# Patient Record
Sex: Male | Born: 2000 | Race: Black or African American | Hispanic: No | Marital: Single | Smoking: Never smoker
Health system: Southern US, Community
[De-identification: ages and names within clinical notes are randomized; demographics above are authoritative.]

## PROBLEM LIST (undated history)

## (undated) ENCOUNTER — Ambulatory Visit (HOSPITAL_COMMUNITY): Admission: EM | Payer: Medicaid Other | Source: Home / Self Care

## (undated) ENCOUNTER — Ambulatory Visit: Payer: Commercial Managed Care - HMO

## (undated) DIAGNOSIS — Z9109 Other allergy status, other than to drugs and biological substances: Secondary | ICD-10-CM

## (undated) DIAGNOSIS — F1110 Opioid abuse, uncomplicated: Secondary | ICD-10-CM

## (undated) DIAGNOSIS — F1410 Cocaine abuse, uncomplicated: Secondary | ICD-10-CM

## (undated) DIAGNOSIS — J45909 Unspecified asthma, uncomplicated: Secondary | ICD-10-CM

## (undated) DIAGNOSIS — F1910 Other psychoactive substance abuse, uncomplicated: Secondary | ICD-10-CM

## (undated) DIAGNOSIS — J302 Other seasonal allergic rhinitis: Secondary | ICD-10-CM

## (undated) HISTORY — PX: TYMPANOSTOMY TUBE PLACEMENT: SHX32

---
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
## 2020-02-17 ENCOUNTER — Encounter (HOSPITAL_COMMUNITY): Payer: Self-pay | Admitting: Emergency Medicine

## 2020-02-17 ENCOUNTER — Other Ambulatory Visit: Payer: Self-pay

## 2020-02-17 ENCOUNTER — Ambulatory Visit (HOSPITAL_COMMUNITY)
Admission: EM | Admit: 2020-02-17 | Discharge: 2020-02-17 | Disposition: A | Discharge status: Home / Self Care | Payer: Medicaid Other | Attending: Family Medicine | Admitting: Family Medicine

## 2020-02-17 DIAGNOSIS — R369 Urethral discharge, unspecified: Secondary | ICD-10-CM | POA: Insufficient documentation

## 2020-02-17 DIAGNOSIS — Z711 Person with feared health complaint in whom no diagnosis is made: Secondary | ICD-10-CM | POA: Diagnosis present

## 2020-02-17 MED ORDER — CEFTRIAXONE SODIUM 500 MG IJ SOLR
500.0000 mg | Freq: Once | INTRAMUSCULAR | Status: AC
  Administered 2020-02-17: 500 mg via INTRAMUSCULAR

## 2020-02-17 MED ORDER — DOXYCYCLINE HYCLATE 100 MG PO CAPS: 100.0000 mg | ORAL_CAPSULE | Freq: Two times a day (BID) | ORAL | 0 refills | Status: DC

## 2020-02-17 MED ORDER — LIDOCAINE HCL (PF) 1 % IJ SOLN
INTRAMUSCULAR | Status: AC
  Filled 2020-02-17: qty 2

## 2020-02-17 MED ORDER — CEFTRIAXONE SODIUM 500 MG IJ SOLR
INTRAMUSCULAR | Status: AC
  Filled 2020-02-17: qty 500

## 2020-02-17 NOTE — ED Provider Notes (Signed)
[facility]   [id number] 02/17/20 Arrival Time: 4098  ASSESSMENT & PLAN:  1. Penile discharge   2. Concern about STD in male without diagnosis       Discharge Instructions     You have been given the following today for treatment of suspected gonorrhea and/or chlamydia:  cefTRIAXone (ROCEPHIN) injection 500 mg  Please pick up your prescription for doxycycline 100 mg and begin taking twice daily for the next seven (7) days.  Even though we have treated you today, we have sent testing for sexually transmitted infections. We will notify you of any positive results once they are received. If required, we will prescribe any medications you might need.  Please refrain from all sexual activity for at least the next seven days.     Pending: Labs Reviewed  CYTOLOGY, (ORAL, ANAL, URETHRAL) ANCILLARY ONLY    Will notify of any positive results. Instructed to refrain from sexual activity for at least seven days.  Reviewed expectations re: course of current medical issues. Questions answered. Outlined signs and symptoms indicating need for more acute intervention. Patient verbalized understanding. After Visit Summary given.   SUBJECTIVE:  Steven Hood is a 20 y.o. male who presents with complaint of penile discharge. Onset abrupt. First noticed yesterday. Describes discharge as thick and opaque. No specific aggravating or alleviating factors reported. Denies: urinary frequency, dysuria and gross hematuria. Afebrile. No abdominal or pelvic pain. No n/v. No rashes or lesions. Reports that he is sexually active with multiple male partners (2). .lab OTC treatment: none. History of STI: "yes" but does not elaborate.   OBJECTIVE:  Vitals:   02/17/20 1011  BP: 128/75  Pulse: 72  Resp: 16  Temp: 98.8 F (37.1 C)  TempSrc: Oral  SpO2: 97%     General appearance: alert, cooperative, appears stated age and no distress Throat: lips, mucosa, and tongue normal;  teeth and gums normal Lungs: unlabored respirations; speaks full sentences without difficulty Back: no CVA tenderness; FROM at waist Abdomen: soft, non-tender GU: deferred Skin: warm and dry Psychological: alert and cooperative; normal mood and affect.      Allergies  Allergen Reactions  . Other Anaphylaxis    All Nuts  . Peanut-Containing Drug Products   . Shellfish Allergy Hives, Swelling and Rash    All Seafood    Past Medical History:  Diagnosis Date  . Asthma   . Environmental allergies   . Seasonal allergies    Family History  Problem Relation Age of Onset  . Healthy Mother   . Healthy Father    Social History   Socioeconomic History  . Marital status: Single    Spouse name: Not on file  . Number of children: Not on file  . Years of education: Not on file  . Highest education level: Not on file  Occupational History  . Not on file  Tobacco Use  . Smoking status: Never Smoker  . Smokeless tobacco: Never Used  Vaping Use  . Vaping Use: Never used  Substance and Sexual Activity  . Alcohol use: No  . Drug use: Yes    Frequency: 7.0 times per week    Types: Marijuana  . Sexual activity: Never  Other Topics Concern  . Not on file  Social History Narrative  . Not on file   Social Determinants of Health   Financial Resource Strain: Not on file  Food Insecurity: Not on file  Transportation Needs: Not on file  Physical Activity: Not on  file  Stress: Not on file  Social Connections: Not on file  Intimate Partner Violence: Not on file          Mardella Layman, MD 02/17/20 1042

## 2020-02-17 NOTE — Discharge Instructions (Addendum)

## 2020-02-17 NOTE — ED Triage Notes (Signed)
Presents with penile discharge and dysuria xs 2 days. States dysuria has subsided now.

## 2020-02-18 LAB — CYTOLOGY, (ORAL, ANAL, URETHRAL) ANCILLARY ONLY
Chlamydia: POSITIVE — AB
Comment: NEGATIVE
Comment: NEGATIVE
Comment: NORMAL
Neisseria Gonorrhea: NEGATIVE
Trichomonas: NEGATIVE

## 2020-05-16 ENCOUNTER — Emergency Department (HOSPITAL_COMMUNITY)
Admission: EM | Admit: 2020-05-16 | Discharge: 2020-05-16 | Disposition: A | Discharge status: Home / Self Care | Payer: Medicaid Other | Attending: Emergency Medicine | Admitting: Emergency Medicine

## 2020-05-16 ENCOUNTER — Other Ambulatory Visit: Payer: Self-pay

## 2020-05-16 ENCOUNTER — Emergency Department (HOSPITAL_COMMUNITY): Payer: Medicaid Other

## 2020-05-16 ENCOUNTER — Encounter (HOSPITAL_COMMUNITY): Payer: Self-pay | Admitting: Emergency Medicine

## 2020-05-16 DIAGNOSIS — W3400XA Accidental discharge from unspecified firearms or gun, initial encounter: Secondary | ICD-10-CM | POA: Diagnosis not present

## 2020-05-16 DIAGNOSIS — Z5321 Procedure and treatment not carried out due to patient leaving prior to being seen by health care provider: Secondary | ICD-10-CM | POA: Insufficient documentation

## 2020-05-16 DIAGNOSIS — S61501A Unspecified open wound of right wrist, initial encounter: Secondary | ICD-10-CM | POA: Insufficient documentation
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
## 2020-05-16 NOTE — ED Notes (Addendum)
Pt called 3x no answer  

## 2020-09-23 ENCOUNTER — Other Ambulatory Visit: Payer: Self-pay

## 2020-09-23 ENCOUNTER — Ambulatory Visit (HOSPITAL_COMMUNITY)
Admission: EM | Admit: 2020-09-23 | Discharge: 2020-09-23 | Disposition: A | Discharge status: Home / Self Care | Payer: Medicaid Other | Attending: Physician Assistant | Admitting: Physician Assistant

## 2020-09-23 ENCOUNTER — Encounter (HOSPITAL_COMMUNITY): Payer: Self-pay

## 2020-09-23 DIAGNOSIS — R369 Urethral discharge, unspecified: Secondary | ICD-10-CM | POA: Diagnosis not present

## 2020-09-23 DIAGNOSIS — Z113 Encounter for screening for infections with a predominantly sexual mode of transmission: Secondary | ICD-10-CM | POA: Diagnosis not present

## 2020-09-23 DIAGNOSIS — H66002 Acute suppurative otitis media without spontaneous rupture of ear drum, left ear: Secondary | ICD-10-CM | POA: Insufficient documentation

## 2020-09-23 MED ORDER — CEFTRIAXONE SODIUM 500 MG IJ SOLR
INTRAMUSCULAR | Status: AC
  Filled 2020-09-23: qty 500

## 2020-09-23 MED ORDER — LIDOCAINE HCL (PF) 2 % IJ SOLN
INTRAMUSCULAR | Status: AC
  Filled 2020-09-23: qty 5

## 2020-09-23 MED ORDER — CEFTRIAXONE SODIUM 500 MG IJ SOLR
500.0000 mg | Freq: Once | INTRAMUSCULAR | Status: AC
  Administered 2020-09-23: 500 mg via INTRAMUSCULAR

## 2020-09-23 MED ORDER — AMOXICILLIN-POT CLAVULANATE 875-125 MG PO TABS: 1.0000 | ORAL_TABLET | Freq: Two times a day (BID) | ORAL | 0 refills | Status: DC

## 2020-09-23 NOTE — Discharge Instructions (Addendum)
We treated you for gonorrhea.  We will send off your swab and contact you if we need to arrange any additional treatment.  Please avoid sexual contact until you receive your test results that have been completely treated.  All partners need to be tested and treated as well.  You have an ear infection on exam.  Please start Augmentin twice daily for 7 days.  You can use over-the-counter medication such as Tylenol and ibuprofen for additional pain relief.  If your symptoms are not improving please return for reevaluation.

## 2020-09-23 NOTE — ED Provider Notes (Signed)
[facility]    CSN: [id number] Arrival date & time: 09/23/20  1635      History   Chief Complaint Chief Complaint  Patient presents with   SEXUALLY TRANSMITTED DISEASE    HPI Steven Hood is a 20 y.o. male.   Patient presents today with a 2-day history of penile discharge.  He reports some associated dysuria but denies additional symptoms including fever, abdominal pain, genital lesions, nausea, vomiting.  Reports that he had unprotected sex last week and reports symptoms began several days after this encounter.  He does have a history of STI in the past and has been treated for gonorrhea and chlamydia; reports last time he was treated was approximately a year ago he completed treatment and had negative test of cure.  He does not consistently use condoms.  He is interested in treatment today if appropriate.  In addition, patient reports a several week history of worsening left ear pain.  He reports having some drainage from the ear after he went swimming earlier this summer but then has developed worsening otalgia.  He denies any decreased hearing, fever, nasal congestion.  Denies any recent illness.  He denies any recent antibiotic use.  He has not tried any over-the-counter medications for symptom management.  He does have a history of allergies and is taking allergy medication as needed.   Past Medical History:  Diagnosis Date   Asthma    Environmental allergies    Seasonal allergies     Patient Active Problem List   Diagnosis Date Noted   ASTHMA 08/20/2006    Past Surgical History:  Procedure Laterality Date   TYMPANOSTOMY TUBE PLACEMENT         Home Medications    Prior to Admission medications   Medication Sig Start Date End Date Taking? Authorizing Provider  amoxicillin-clavulanate (AUGMENTIN) 875-125 MG tablet Take 1 tablet by mouth every 12 (twelve) hours. 09/23/20  Yes Casmir Auguste K, PA-C  ibuprofen (ADVIL,MOTRIN) 400 MG tablet Take 1 tablet  (400 mg total) by mouth every 6 (six) hours as needed for pain. 06/15/12   Elwin Mocha, MD  albuterol (PROVENTIL HFA;VENTOLIN HFA) 108 (90 BASE) MCG/ACT inhaler Inhale 2 puffs into the lungs every 6 (six) hours as needed for wheezing.  11/08/18  [provider]  fluticasone (FLONASE) 50 MCG/ACT nasal spray Place 1 spray into both nostrils 2 (two) times daily. 12/09/12 11/08/18  Autumn Messing H, PA-C  montelukast (SINGULAIR) 5 MG chewable tablet Chew 5 mg by mouth at bedtime.  11/08/18  [provider]    Family History Family History  Problem Relation Age of Onset   Healthy Mother    Healthy Father     Social History Social History   Tobacco Use   Smoking status: Never   Smokeless tobacco: Never  Vaping Use   Vaping Use: Never used  Substance Use Topics   Alcohol use: No   Drug use: Yes    Frequency: 7.0 times per week    Types: Marijuana     Allergies   Other, Peanut-containing drug products, and Shellfish allergy   Review of Systems Review of Systems  Constitutional:  Negative for activity change, appetite change, fatigue and fever.  HENT:  Positive for ear pain. Negative for congestion, sinus pressure, sneezing and sore throat.   Respiratory:  Negative for cough and shortness of breath.   Cardiovascular:  Negative for chest pain.  Gastrointestinal:  Negative for abdominal pain, diarrhea, nausea and vomiting.  Genitourinary:  Positive for dysuria and penile discharge. Negative for frequency, hematuria, penile pain and urgency.    Physical Exam Triage Vital Signs ED Triage Vitals  Enc Vitals Group     BP 09/23/20 1736 121/70     Pulse Rate 09/23/20 1736 88     Resp 09/23/20 1736 16     Temp 09/23/20 1736 98.8 F (37.1 C)     Temp Source 09/23/20 1736 Oral     SpO2 09/23/20 1736 96 %     Weight --      Height --      Head Circumference --      Peak Flow --      Pain Score 09/23/20 1734 0     Pain Loc --      Pain Edu? --      Excl. in GC?  --    No data found.  Updated Vital Signs BP 121/70 (BP Location: Right Arm)   Pulse 88   Temp 98.8 F (37.1 C) (Oral)   Resp 16   SpO2 96%   Visual Acuity Right Eye Distance:   Left Eye Distance:   Bilateral Distance:    Right Eye Near:   Left Eye Near:    Bilateral Near:     Physical Exam Vitals reviewed.  Constitutional:      General: He is awake.     Appearance: Normal appearance. He is normal weight. He is not ill-appearing.     Comments: Very pleasant male appears stated age in no acute distress sitting comfortably in exam room  HENT:     Head: Normocephalic and atraumatic.     Right Ear: Tympanic membrane, ear canal and external ear normal. Tympanic membrane is not erythematous or bulging.     Left Ear: Ear canal and external ear normal. Tympanic membrane is injected and bulging. Tympanic membrane is not erythematous.     Nose: Nose normal.     Mouth/Throat:     Pharynx: Uvula midline. No oropharyngeal exudate or posterior oropharyngeal erythema.  Cardiovascular:     Rate and Rhythm: Normal rate and regular rhythm.     Heart sounds: Normal heart sounds, S1 normal and S2 normal. No murmur heard. Pulmonary:     Effort: Pulmonary effort is normal. No accessory muscle usage or respiratory distress.     Breath sounds: Normal breath sounds. No stridor. No wheezing, rhonchi or rales.     Comments: Clear to auscultation bilaterally Abdominal:     General: Bowel sounds are normal.     Palpations: Abdomen is soft.     Tenderness: There is no abdominal tenderness.  Genitourinary:    Comments: Exam deferred Neurological:     Mental Status: He is alert.  Psychiatric:        Behavior: Behavior is cooperative.     UC Treatments / Results  Labs (all labs ordered are listed, but only abnormal results are displayed) Labs Reviewed  CYTOLOGY, (ORAL, ANAL, URETHRAL) ANCILLARY ONLY    EKG   Radiology No results found.  Procedures Procedures (including critical  care time)  Medications Ordered in UC Medications  cefTRIAXone (ROCEPHIN) injection 500 mg (has no administration in time range)    Initial Impression / Assessment and Plan / UC Course  I have reviewed the triage vital signs and the nursing notes.  Pertinent labs & imaging results that were available during my care of the patient were reviewed by me and considered in my medical decision making (see  chart for details).      STI swab collected today-results pending.  Patient was empirically treated for gonorrhea given symptoms will defer additional treatment until STI results are obtained.  Discussed the importance of safe sex.  He was instructed to to avoid sexual contact until results are obtained and all partners are tested and treated.  Discussed alarm symptoms that warrant emergent evaluation.  Patient was noted to have otitis media on physical exam and so was started on Augmentin.  Recommended that he continue over-the-counter allergy medications including Flonase and antihistamine for symptom relief.  He can use over-the-counter medication for symptom management.  Discussed that if symptoms are not improving or if he has any worsening symptoms he needs to be reevaluated.  Strict return precautions given to which she expressed understanding.  Final Clinical Impressions(s) / UC Diagnoses   Final diagnoses:  Penile discharge  Routine screening for STI (sexually transmitted infection)  Non-recurrent acute suppurative otitis media of left ear without spontaneous rupture of tympanic membrane     Discharge Instructions      We treated you for gonorrhea.  We will send off your swab and contact you if we need to arrange any additional treatment.  Please avoid sexual contact until you receive your test results that have been completely treated.  All partners need to be tested and treated as well.  You have an ear infection on exam.  Please start Augmentin twice daily for 7 days.  You can  use over-the-counter medication such as Tylenol and ibuprofen for additional pain relief.  If your symptoms are not improving please return for reevaluation.     ED Prescriptions     Medication Sig Dispense Auth. Provider   amoxicillin-clavulanate (AUGMENTIN) 875-125 MG tablet Take 1 tablet by mouth every 12 (twelve) hours. 14 tablet Krizia Flight, Noberto Retort, PA-C      PDMP not reviewed this encounter.   Jeani Hawking, PA-C 09/23/20 1826

## 2020-09-23 NOTE — ED Triage Notes (Signed)
Patient presents to Urgent Care for STD testing. Pt states he believes he has chlamydia and gonorrhea. He states he has penile discharge and burning since 2 days ago. Pt states new sexual encounter last Friday. He did not use condoms.   Denies fever or abdominal pain.

## 2020-09-24 ENCOUNTER — Telehealth (HOSPITAL_COMMUNITY): Payer: Self-pay | Admitting: Emergency Medicine

## 2020-09-24 LAB — CYTOLOGY, (ORAL, ANAL, URETHRAL) ANCILLARY ONLY
Chlamydia: POSITIVE — AB
Comment: NEGATIVE
Comment: NEGATIVE
Comment: NORMAL
Neisseria Gonorrhea: POSITIVE — AB
Trichomonas: NEGATIVE

## 2020-09-24 MED ORDER — DOXYCYCLINE HYCLATE 100 MG PO CAPS: 100.0000 mg | ORAL_CAPSULE | Freq: Two times a day (BID) | ORAL | 0 refills | Status: AC

## 2021-01-09 DIAGNOSIS — B009 Herpesviral infection, unspecified: Secondary | ICD-10-CM

## 2021-01-09 HISTORY — DX: Herpesviral infection, unspecified: B00.9

## 2021-02-01 ENCOUNTER — Other Ambulatory Visit: Payer: Self-pay

## 2021-02-02 ENCOUNTER — Other Ambulatory Visit: Payer: Self-pay

## 2021-02-02 ENCOUNTER — Other Ambulatory Visit (HOSPITAL_COMMUNITY)
Admission: RE | Admit: 2021-02-02 | Discharge: 2021-02-02 | Disposition: A | Discharge status: Home / Self Care | Payer: Self-pay | Source: Ambulatory Visit | Attending: Infectious Disease | Admitting: Infectious Disease

## 2021-02-02 ENCOUNTER — Ambulatory Visit (INDEPENDENT_AMBULATORY_CARE_PROVIDER_SITE_OTHER): Payer: Self-pay | Admitting: Pharmacist

## 2021-02-02 DIAGNOSIS — H6690 Otitis media, unspecified, unspecified ear: Secondary | ICD-10-CM

## 2021-02-02 DIAGNOSIS — A549 Gonococcal infection, unspecified: Secondary | ICD-10-CM

## 2021-02-02 DIAGNOSIS — Z113 Encounter for screening for infections with a predominantly sexual mode of transmission: Secondary | ICD-10-CM | POA: Insufficient documentation

## 2021-02-02 DIAGNOSIS — A749 Chlamydial infection, unspecified: Secondary | ICD-10-CM

## 2021-02-02 MED ORDER — CEFTRIAXONE SODIUM 500 MG IJ SOLR
500.0000 mg | Freq: Once | INTRAMUSCULAR | Status: AC
  Administered 2021-02-02: 11:00:00 500 mg via INTRAMUSCULAR

## 2021-02-02 MED ORDER — DOXYCYCLINE HYCLATE 100 MG PO TABS: 100.0000 mg | ORAL_TABLET | Freq: Two times a day (BID) | ORAL | 0 refills | Status: AC

## 2021-02-02 NOTE — Progress Notes (Signed)
° °  02/02/2021  HPI: Steven Hood is a 21 y.o. male who presents to the [facility] clinic today to follow up for STI screening.  Patient Active Problem List   Diagnosis Date Noted   ASTHMA 08/20/2006    Patient's Medications  New Prescriptions   DOXYCYCLINE (VIBRA-TABS) 100 MG TABLET    Take 1 tablet (100 mg total) by mouth 2 (two) times daily for 7 days.  Previous Medications   IBUPROFEN (ADVIL,MOTRIN) 400 MG TABLET    Take 1 tablet (400 mg total) by mouth every 6 (six) hours as needed for pain.  Modified Medications   No medications on file  Discontinued Medications   No medications on file    Allergies: Allergies  Allergen Reactions   Other Anaphylaxis    All Nuts   Peanut-Containing Drug Products    Shellfish Allergy Hives, Swelling and Rash    All Seafood    Past Medical History: Past Medical History:  Diagnosis Date   Asthma    Environmental allergies    Seasonal allergies     Social History: Social History   Socioeconomic History   Marital status: Single    Spouse name: Not on file   Number of children: Not on file   Years of education: Not on file   Highest education level: Not on file  Occupational History   Not on file  Tobacco Use   Smoking status: Never   Smokeless tobacco: Never  Vaping Use   Vaping Use: Never used  Substance and Sexual Activity   Alcohol use: No   Drug use: Yes    Frequency: 7.0 times per week    Types: Marijuana   Sexual activity: Yes    Birth control/protection: None  Other Topics Concern   Not on file  Social History Narrative   Not on file   Social Determinants of Health   Financial Resource Strain: Not on file  Food Insecurity: Not on file  Transportation Needs: Not on file  Physical Activity: Not on file  Stress: Not on file  Social Connections: Not on file     Assessment: Steven Hood presents today for STI screening. Reports symptoms x2 days of penile discharge and burning when urinating. Tested positive for  gonorrhea and chlamydia in 09/2020. Check HIV antibody, RPR, and urine cytologies for gonorrhea/chlamydia. Will call patient and update on results. Will treat empirically for gonorrhea and chlamydia today. Administered ceftriaxone 500mg  IM in the right upper outer quadrant of the gluteal muscle. Injection was tolerated well without issues. Prescription for doxycycline 100mg  BID x 7 days sent to [facility] on [address].  Also reports an ear infection that has persisted for >6 months. Notes discharge, pain, and difficulty hearing from left ear. Referral made to ENT, patient is aware.  Plan: -- Administered ceftriaxone -- Doxycycline sent to [facility] on [address] -- Check HIV antibody, RPR, urine cytologies for gonorrhea/chlamydia -- Referral to ENT placed -- Call with any issues or questions  [facility]) Dhiren Azimi, PharmD Student  02/02/2021, 11:14 AM

## 2021-02-03 LAB — RPR: RPR Ser Ql: NONREACTIVE

## 2021-02-03 LAB — HIV ANTIBODY (ROUTINE TESTING W REFLEX): HIV 1&2 Ab, 4th Generation: NONREACTIVE

## 2021-02-04 LAB — URINE CYTOLOGY ANCILLARY ONLY
Chlamydia: NEGATIVE
Comment: NEGATIVE
Comment: NORMAL
Neisseria Gonorrhea: POSITIVE — AB

## 2021-06-04 IMAGING — CR DG HUMERUS 2V *R*
2 series · 2 of 2 positions shown · non-contrast
Comparison: None.

CLINICAL DATA: Laceration, fall through window

EXAM:
RIGHT HUMERUS - 2+ VIEW

[x humerus ap right]
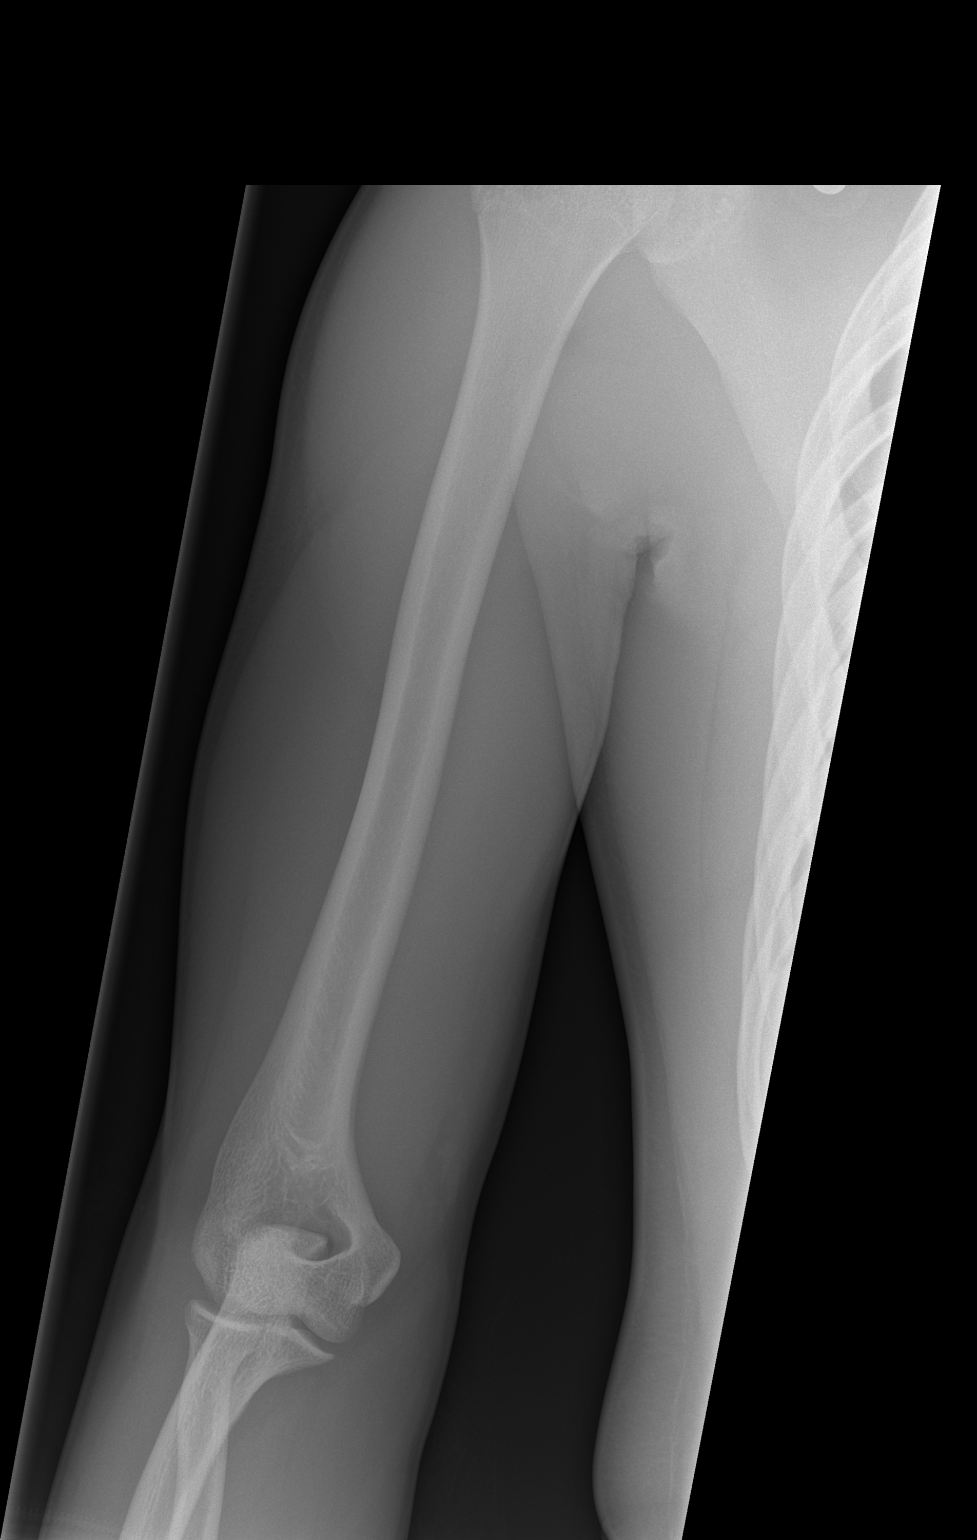

[x humerus lat right]
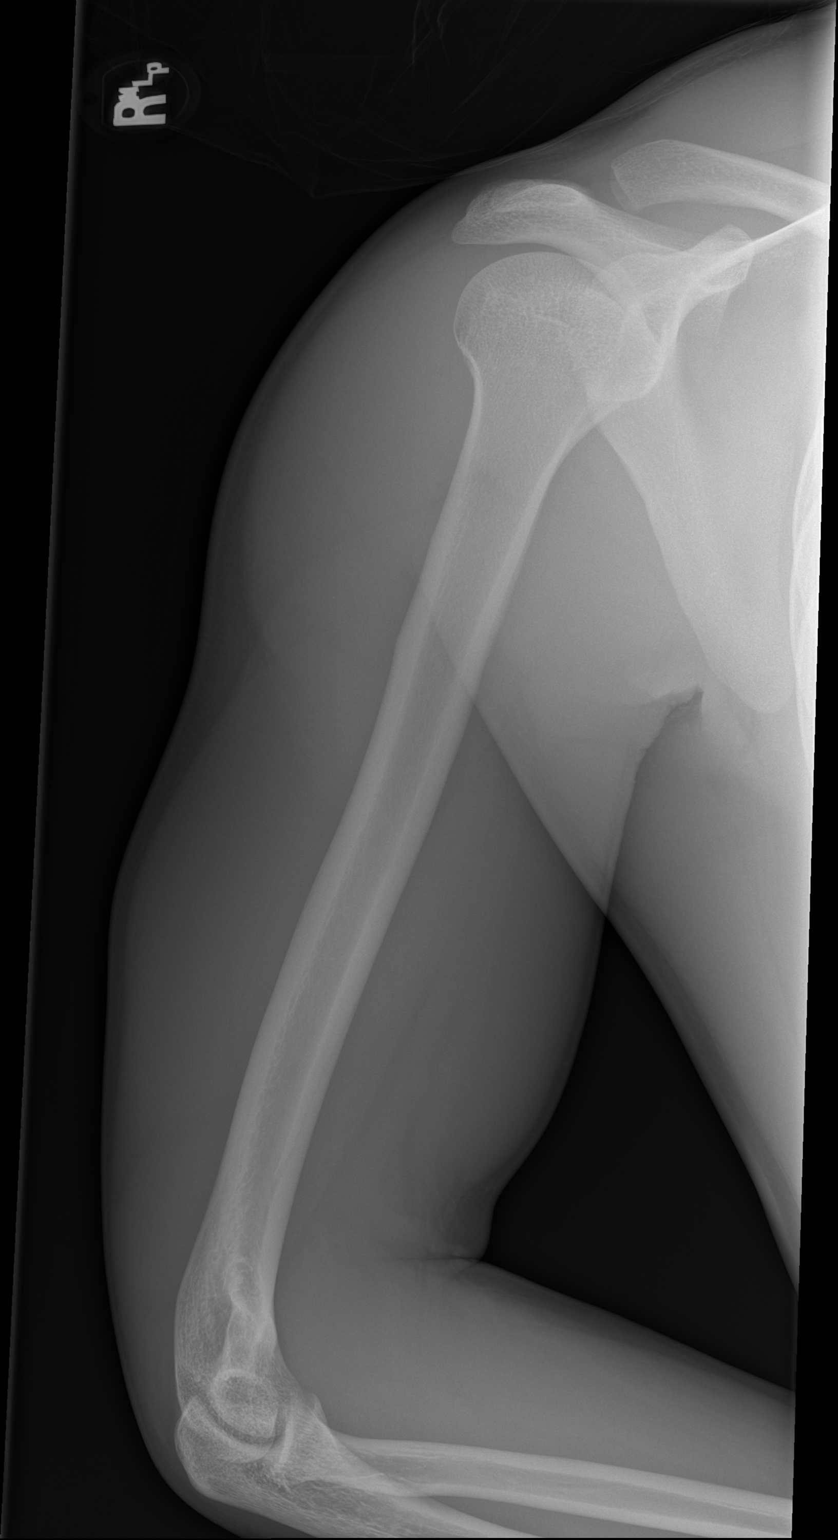

[2 of 2 positions shown; findings below may reference images not displayed]

FINDINGS: There is no evidence of fracture or other focal bone lesions. Soft
tissues are unremarkable. No radiopaque foreign bodies.
IMPRESSION: Negative.

## 2021-07-27 ENCOUNTER — Encounter (HOSPITAL_COMMUNITY): Payer: Self-pay | Admitting: Emergency Medicine

## 2021-07-27 ENCOUNTER — Ambulatory Visit (HOSPITAL_COMMUNITY)
Admission: EM | Admit: 2021-07-27 | Discharge: 2021-07-27 | Disposition: A | Discharge status: Home / Self Care | Payer: BLUE CROSS/BLUE SHIELD | Attending: Emergency Medicine | Admitting: Emergency Medicine

## 2021-07-27 DIAGNOSIS — B002 Herpesviral gingivostomatitis and pharyngotonsillitis: Secondary | ICD-10-CM | POA: Diagnosis not present

## 2021-07-27 DIAGNOSIS — A6001 Herpesviral infection of penis: Secondary | ICD-10-CM | POA: Diagnosis not present

## 2021-07-27 DIAGNOSIS — L219 Seborrheic dermatitis, unspecified: Secondary | ICD-10-CM | POA: Insufficient documentation

## 2021-07-27 LAB — HIV ANTIBODY (ROUTINE TESTING W REFLEX): HIV Screen 4th Generation wRfx: NONREACTIVE

## 2021-07-27 MED ORDER — VALACYCLOVIR HCL 1 G PO TABS: 1000.0000 mg | ORAL_TABLET | Freq: Two times a day (BID) | ORAL | 0 refills | Status: AC

## 2021-07-27 MED ORDER — LIDOCAINE VISCOUS HCL 2 % MT SOLN: 15.0000 mL | OROMUCOSAL | 0 refills | Status: DC | PRN

## 2021-07-27 MED ORDER — KETOCONAZOLE 2 % EX SHAM: 1.0000 | MEDICATED_SHAMPOO | CUTANEOUS | 0 refills | Status: DC

## 2021-07-27 NOTE — Discharge Instructions (Addendum)
Please take the medicine twice daily for 10 days. Try the viscous lidocaine swish-and-spit to help with mouth pain.  Please abstain from sexual intercourse until treatment is complete.  We will call you if your results return positive.  You can use the shampoo twice weekly. This should help with the scalp itch and rash.

## 2021-07-27 NOTE — ED Triage Notes (Signed)
Patient requesting medication for herpes outbreak on his tongue x 3 days.  His tongue is very painful, unable to eat or swallow.  Also theres a rash on his forehead as well.  Patient denies any medications.

## 2021-07-27 NOTE — ED Provider Notes (Signed)
[facility]    CSN: [id number] Arrival date & time: 07/27/21  1212     History   Chief Complaint Chief Complaint  Patient presents with   Herpes Outbreak    HPI Steven Hood is a 21 y.o. male.  Presents with new herpes outbreak x 3 days.  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  Reports pain with eating and swallowing.  Reports the lesions on his penis are painless.  Denies any penile discharge, urinary symptoms. Last HIV and RPR testing were in January and were negative.  Additionally reports rash to the scalp that is very itchy for about 7 months.  He has tried different scalp products, changing shampoos, oil with no relief.  Past Medical History:  Diagnosis Date   Asthma    Environmental allergies    Seasonal allergies     Patient Active Problem List   Diagnosis Date Noted   ASTHMA 08/20/2006    Past Surgical History:  Procedure Laterality Date   TYMPANOSTOMY TUBE PLACEMENT       Home Medications    Prior to Admission medications   Medication Sig Start Date End Date Taking? Authorizing Provider  ketoconazole (NIZORAL) 2 % shampoo Apply 1 Application topically 2 (two) times a week. 07/28/21  Yes Boniface Goffe, PA-C  lidocaine (XYLOCAINE) 2 % solution Use as directed 15 mLs in the mouth or throat as needed for mouth pain. 07/27/21  Yes Shemeka Wardle, Lurena Joiner, PA-C  valACYclovir (VALTREX) 1000 MG tablet Take 1 tablet (1,000 mg total) by mouth 2 (two) times daily for 10 days. 07/27/21 08/06/21 Yes Jamiya Nims, Lurena Joiner, PA-C  ibuprofen (ADVIL,MOTRIN) 400 MG tablet Take 1 tablet (400 mg total) by mouth every 6 (six) hours as needed for pain. 06/15/12   Elwin Mocha, MD  albuterol (PROVENTIL HFA;VENTOLIN HFA) 108 (90 BASE) MCG/ACT inhaler Inhale 2 puffs into the lungs every 6 (six) hours as needed for wheezing.  11/08/18  [provider]  fluticasone (FLONASE) 50 MCG/ACT nasal spray Place 1 spray into both nostrils 2 (two)  times daily. 12/09/12 11/08/18  Autumn Messing H, PA-C  montelukast (SINGULAIR) 5 MG chewable tablet Chew 5 mg by mouth at bedtime.  11/08/18  [provider]    Family History Family History  Problem Relation Age of Onset   Healthy Mother    Healthy Father     Social History Social History   Tobacco Use   Smoking status: Some Days    Types: Cigarettes   Smokeless tobacco: Never  Vaping Use   Vaping Use: Never used  Substance Use Topics   Alcohol use: No   Drug use: Not Currently    Frequency: 7.0 times per week    Types: Marijuana     Allergies   Other, Peanut-containing drug products, and Shellfish allergy   Review of Systems Review of Systems Per HPI  Physical Exam Triage Vital Signs ED Triage Vitals  Enc Vitals Group     BP 07/27/21 1229 128/86     Pulse Rate 07/27/21 1229 84     Resp 07/27/21 1229 18     Temp 07/27/21 1229 98.5 F (36.9 C)     Temp Source 07/27/21 1229 Oral     SpO2 07/27/21 1229 95 %     Weight 07/27/21 1231 192 lb (87.1 kg)     Height 07/27/21 1231 5\' 10"  (1.778 m)     Head Circumference --      Peak Flow --  Pain Score 07/27/21 1231 7     Pain Loc --      Pain Edu? --      Excl. in GC? --    No data found.  Updated Vital Signs BP 128/86 (BP Location: Right Arm)   Pulse 84   Temp 98.5 F (36.9 C) (Oral)   Resp 18   Ht 5\' 10"  (1.778 m)   Wt 192 lb (87.1 kg)   SpO2 95%   BMI 27.55 kg/m    Physical Exam Vitals and nursing note reviewed.  HENT:     Mouth/Throat:     Lips: No lesions.     Mouth: Mucous membranes are moist. Oral lesions present.     Tongue: Lesions present.     Palate: Lesions present.     Comments: Multiple erythematous lesions with central clearing to the tongue, hard palate.  A couple lesions extend across the right outer cheek Eyes:     Conjunctiva/sclera: Conjunctivae normal.  Cardiovascular:     Rate and Rhythm: Normal rate and regular rhythm.     Pulses: Normal pulses.     Heart  sounds: Normal heart sounds.  Pulmonary:     Effort: Pulmonary effort is normal.     Breath sounds: Normal breath sounds.  Abdominal:     General: Bowel sounds are normal.     Tenderness: There is no abdominal tenderness.  Genitourinary:    Comments: Deferred Skin:    Findings: Rash present.     Comments: Erythematous, scaling rash to the hairline.  Patient reports it extends to his scalp  Neurological:     Mental Status: He is oriented to person, place, and time.     UC Treatments / Results  Labs (all labs ordered are listed, but only abnormal results are displayed) Labs Reviewed    HIV ANTIBODY (ROUTINE TESTING W REFLEX)  RPR    EKG  Radiology No results found.  Procedures Procedures   Medications Ordered in UC Medications - No data to display  Initial Impression / Assessment and Plan / UC Course  I have reviewed the triage vital signs and the nursing notes.  Pertinent labs & imaging results that were available during my care of the patient were reviewed by me and considered in my medical decision making (see chart for details).  RPR and HIV pending.  Patient declined further STD testing. Patient blood clotting after draws - still sent to lab.  Valacyclovir twice daily for 10 days.  Also prescribed viscous lidocaine for mouth pain.   Recommend ketoconazole shampoo twice weekly.  This should help with the scalp rash I believe to be seborrheic dermatitis.  Set him up with a primary care provider in clinic.  Also activated MyChart.  He can return with any concerns but can also follow-up with primary care.  Any worsening symptoms he understands to go to the emergency department.  Discharged in stable condition.  Final Clinical Impressions(s) / UC Diagnoses   Final diagnoses:  Oral herpes  Herpes simplex infection of penis  Seborrheic dermatitis of scalp     Discharge Instructions      Please take the medicine twice daily for 10 days. Try the viscous  lidocaine swish-and-spit to help with mouth pain.  Please abstain from sexual intercourse until treatment is complete.  We will call you if your results return positive.  You can use the shampoo twice weekly. This should help with the scalp itch and rash.    ED Prescriptions  Medication Sig Dispense Auth. Provider   ketoconazole (NIZORAL) 2 % shampoo Apply 1 Application topically 2 (two) times a week. 120 mL Relda Agosto, PA-C   valACYclovir (VALTREX) 1000 MG tablet Take 1 tablet (1,000 mg total) by mouth 2 (two) times daily for 10 days. 20 tablet Adarius Tigges, PA-C   lidocaine (XYLOCAINE) 2 % solution Use as directed 15 mLs in the mouth or throat as needed for mouth pain. 100 mL Brysin Towery, Lurena Joiner, PA-C      PDMP not reviewed this encounter.   Arnice Vanepps, Lurena Joiner, PA-C 07/27/21 1340

## 2021-07-28 LAB — RPR
RPR Ser Ql: REACTIVE — AB
RPR Titer: 1:8 {titer}

## 2021-07-29 LAB — T.PALLIDUM AB, TOTAL: T Pallidum Abs: REACTIVE — AB

## 2021-08-01 ENCOUNTER — Telehealth (HOSPITAL_COMMUNITY): Payer: Self-pay | Admitting: Emergency Medicine

## 2021-08-01 NOTE — Telephone Encounter (Signed)
Patient will need treatment with IM Bicillin 2.4 million units for positive Syphilis, per Lurena Joiner, APP. Contacted patient by phone.  Verified identity using two identifiers.  Provided positive result.  Reviewed safe sex practices, notifying partners, and refraining from sexual activities for 7 days from time of treatment.  Patient verified understanding, all questions answered.   [facility] notified

## 2021-08-02 ENCOUNTER — Ambulatory Visit (HOSPITAL_COMMUNITY)
Admission: EM | Admit: 2021-08-02 | Discharge: 2021-08-02 | Disposition: A | Discharge status: Home / Self Care | Payer: Medicaid Other | Attending: Family Medicine | Admitting: Family Medicine

## 2021-08-02 ENCOUNTER — Other Ambulatory Visit: Payer: Self-pay

## 2021-08-02 ENCOUNTER — Ambulatory Visit: Payer: Medicaid Other | Admitting: Family

## 2021-08-02 ENCOUNTER — Encounter (HOSPITAL_COMMUNITY): Payer: Self-pay | Admitting: Emergency Medicine

## 2021-08-02 DIAGNOSIS — A539 Syphilis, unspecified: Secondary | ICD-10-CM

## 2021-08-02 MED ORDER — PENICILLIN G BENZATHINE 1200000 UNIT/2ML IM SUSY
2.4000 10*6.[IU] | PREFILLED_SYRINGE | Freq: Once | INTRAMUSCULAR | Status: AC
Start: 2021-08-02 — End: 2021-08-02
  Administered 2021-08-02: 2.4 10*6.[IU] via INTRAMUSCULAR

## 2021-08-02 MED ORDER — PENICILLIN G BENZATHINE 1200000 UNIT/2ML IM SUSY
PREFILLED_SYRINGE | INTRAMUSCULAR | Status: AC
  Filled 2021-08-02: qty 4

## 2021-08-02 NOTE — Discharge Instructions (Signed)
Will call with remaining test results if positive

## 2021-08-02 NOTE — ED Triage Notes (Signed)
Patient states he is here for syphilis treatment

## 2021-09-25 ENCOUNTER — Ambulatory Visit (HOSPITAL_COMMUNITY)
Admission: EM | Admit: 2021-09-25 | Discharge: 2021-09-25 | Disposition: A | Discharge status: Home / Self Care | Payer: Medicaid Other | Attending: Emergency Medicine | Admitting: Emergency Medicine

## 2021-09-25 ENCOUNTER — Encounter (HOSPITAL_COMMUNITY): Payer: Self-pay

## 2021-09-25 DIAGNOSIS — L219 Seborrheic dermatitis, unspecified: Secondary | ICD-10-CM | POA: Diagnosis not present

## 2021-09-25 MED ORDER — KETOCONAZOLE 2 % EX SHAM: 1.0000 | MEDICATED_SHAMPOO | CUTANEOUS | 1 refills | Status: DC

## 2021-09-25 MED ORDER — FLUCONAZOLE 150 MG PO TABS: 150.0000 mg | ORAL_TABLET | ORAL | 0 refills | Status: AC

## 2021-09-25 NOTE — ED Provider Notes (Signed)
[place]    CSN: [id number] Arrival date & time: 09/25/21  1228      History   Chief Complaint Chief Complaint  Patient presents with   Hair/Scalp Problem    HPI Steven Hood is a 21 y.o. male.   Patient presents with rash to the scalp beginning 9 months ago.  Symptoms started abruptly and he cannot recall a precipitating event that would trigger a rash\. scalp is erythematous, pruritic, dry and flaking.  Has attempted use of over-the-counter shampoos which have been ineffective.  Denies pain, drainage, fever or chills.  Has been getting haircut while rashes present.  Was evaluated in urgent care within the last 2 to 3 weeks regarding same symptoms, was prescribed ketoconazole shampoo but endorses that he did not receive medication from pharmacy.  History has seasonal allergies.   Past Medical History:  Diagnosis Date   Asthma    Environmental allergies    Seasonal allergies     Patient Active Problem List   Diagnosis Date Noted   ASTHMA 08/20/2006    Past Surgical History:  Procedure Laterality Date   TYMPANOSTOMY TUBE PLACEMENT         Home Medications    Prior to Admission medications   Medication Sig Start Date End Date Taking? Authorizing Provider  ibuprofen (ADVIL,MOTRIN) 400 MG tablet Take 1 tablet (400 mg total) by mouth every 6 (six) hours as needed for pain. 06/15/12   Evelina Bucy, MD  ketoconazole (NIZORAL) 2 % shampoo Apply 1 Application topically 2 (two) times a week. 07/28/21   Rising, Rebecca, PA-C  lidocaine (XYLOCAINE) 2 % solution Use as directed 15 mLs in the mouth or throat as needed for mouth pain. 07/27/21   Rising, Wells Guiles, PA-C  albuterol (PROVENTIL HFA;VENTOLIN HFA) 108 (90 BASE) MCG/ACT inhaler Inhale 2 puffs into the lungs every 6 (six) hours as needed for wheezing.  11/08/18  [provider]  fluticasone (FLONASE) 50 MCG/ACT nasal spray Place 1 spray into both nostrils 2 (two) times daily. 12/09/12 11/08/18  Allena Katz H, PA-C  montelukast (SINGULAIR) 5 MG chewable tablet Chew 5 mg by mouth at bedtime.  11/08/18  [provider]    Family History Family History  Problem Relation Age of Onset   Healthy Mother    Healthy Father     Social History Social History   Tobacco Use   Smoking status: Former    Types: Cigarettes   Smokeless tobacco: Never  Vaping Use   Vaping Use: Never used  Substance Use Topics   Alcohol use: No   Drug use: Not Currently    Frequency: 7.0 times per week    Types: Marijuana     Allergies   Other, Peanut-containing drug products, and Shellfish allergy   Review of Systems Review of Systems   Physical Exam Triage Vital Signs ED Triage Vitals  Enc Vitals Group     BP 09/25/21 1330 139/74     Pulse Rate 09/25/21 1330 (!) 58     Resp 09/25/21 1330 16     Temp 09/25/21 1330 (!) 97.5 F (36.4 C)     Temp Source 09/25/21 1330 Axillary     SpO2 09/25/21 1330 97 %     Weight --      Height --      Head Circumference --      Peak Flow --      Pain Score 09/25/21 1333 0     Pain Loc --  Pain Edu? --      Excl. in [place]? --    No data found.  Updated Vital Signs BP 139/74 (BP Location: Left Arm)   Pulse (!) 58   Temp (!) 97.5 F (36.4 C) (Axillary)   Resp 16   SpO2 97%   Visual Acuity Right Eye Distance:   Left Eye Distance:   Bilateral Distance:    Right Eye Near:   Left Eye Near:    Bilateral Near:     Physical Exam Constitutional:      Appearance: Normal appearance.  Eyes:     Extraocular Movements: Extraocular movements intact.  Pulmonary:     Effort: Pulmonary effort is normal.  Skin:    Comments: Severe erythema following the hairline of the anterior scalp, scalp appears dry throughout with significant flaking present  Neurological:     Mental Status: He is oriented to person, place, and time. Mental status is at baseline.  Psychiatric:        Mood and Affect: Mood normal.        Behavior: Behavior normal.       UC Treatments / Results  Labs (all labs ordered are listed, but only abnormal results are displayed) Labs Reviewed - No data to display  EKG   Radiology No results found.  Procedures Procedures (including critical care time)  Medications Ordered in UC Medications - No data to display  Initial Impression / Assessment and Plan / UC Course  I have reviewed the triage vital signs and the nursing notes.  Pertinent labs & imaging results that were available during my care of the patient were reviewed by me and considered in my medical decision making (see chart for details).  Seborrheic dermatitis  Prescribed oral Diflucan weekly for 6 weeks and ketoconazole shampoo to be used twice weekly, recommended against getting a haircut until rash has resolved as this may cause further irritation, given walker referral to dermatology for further evaluation and management Final Clinical Impressions(s) / UC Diagnoses   Final diagnoses:  None   Discharge Instructions   None    ED Prescriptions   None    PDMP not reviewed this encounter.   Valinda Hoar, [place] 09/29/21 [phone]

## 2021-09-25 NOTE — Discharge Instructions (Addendum)
Seborrheic dermatitis is a skin disease that causes red, scaly patches. It usually occurs on the scalp, and it is often called dandruff.   Today we will begin treatment for fungus that may be aiding to your symptoms  Take 1 Diflucan tablet weekly for 6 weeks  Use ketoconazole shampoo twice weekly, clean scalp with flakes prior to washing, apply a mineral or olive oil to the hair and scalp and use a comb to gently scratch flakes from the scalp then wash hair  Do not get a haircut for the next 6 weeks and allow the scalp to rest and heal

## 2021-09-25 NOTE — ED Triage Notes (Signed)
Patient having rash on the scalp since January. States the rash is getting worse and spreading. No one around the Patient with similar symptoms. Patient has tried OTC rash creams and medicated shampoos with no relief.   No pain just itching.

## 2022-01-09 DIAGNOSIS — J029 Acute pharyngitis, unspecified: Secondary | ICD-10-CM

## 2022-01-09 HISTORY — DX: Acute pharyngitis, unspecified: J02.9

## 2022-01-16 ENCOUNTER — Ambulatory Visit (HOSPITAL_COMMUNITY)
Admission: EM | Admit: 2022-01-16 | Discharge: 2022-01-16 | Disposition: A | Discharge status: Home / Self Care | Payer: Medicaid Other | Attending: Physician Assistant | Admitting: Physician Assistant

## 2022-01-16 ENCOUNTER — Encounter (HOSPITAL_COMMUNITY): Payer: Self-pay

## 2022-01-16 DIAGNOSIS — L219 Seborrheic dermatitis, unspecified: Secondary | ICD-10-CM | POA: Diagnosis not present

## 2022-01-16 MED ORDER — HALOBETASOL PROPIONATE 0.01 % EX LOTN: 1.0000 | TOPICAL_LOTION | CUTANEOUS | 0 refills | Status: AC

## 2022-01-16 MED ORDER — KETOCONAZOLE 2 % EX SHAM: 1.0000 | MEDICATED_SHAMPOO | CUTANEOUS | 2 refills | Status: AC

## 2022-01-16 NOTE — ED Provider Notes (Signed)
[place]    CSN: [id number] Arrival date & time: 01/16/22  [id number]      History   Chief Complaint Chief Complaint  Patient presents with   Rash    HPI Steven Hood is a 22 y.o. male.   Patient presents today with a prolonged history of pruritic and scaling rash of his scalp.  He has been seen in the past and prescribed ketoconazole shampoo as well as fluconazole with only temporary relief of symptoms.  Reports that he ran out of this medication and is requesting a refill.  He denies any changes to personal hygiene products including soaps or detergents.  He has not seen a dermatologist in the past and denies history of dermatological condition.  Denies any additional symptoms including rash, fever, nausea, vomiting.  No additional complaints or concerns today.    Past Medical History:  Diagnosis Date   Asthma    Environmental allergies    Seasonal allergies     Patient Active Problem List   Diagnosis Date Noted   ASTHMA 08/20/2006    Past Surgical History:  Procedure Laterality Date   TYMPANOSTOMY TUBE PLACEMENT         Home Medications    Prior to Admission medications   Medication Sig Start Date End Date Taking? Authorizing Provider  Halobetasol Propionate 0.01 % LOTN Apply 1 Application topically 2 (two) times a week. 01/16/22  Yes Dain Laseter K, PA-C  ibuprofen (ADVIL,MOTRIN) 400 MG tablet Take 1 tablet (400 mg total) by mouth every 6 (six) hours as needed for pain. 06/15/12   Evelina Bucy, MD  ketoconazole (NIZORAL) 2 % shampoo Apply 1 Application topically 2 (two) times a week. 01/16/22   Shivangi Lutz, Junie Panning K, PA-C  lidocaine (XYLOCAINE) 2 % solution Use as directed 15 mLs in the mouth or throat as needed for mouth pain. 07/27/21   Rising, Wells Guiles, PA-C  albuterol (PROVENTIL HFA;VENTOLIN HFA) 108 (90 BASE) MCG/ACT inhaler Inhale 2 puffs into the lungs every 6 (six) hours as needed for wheezing.  11/08/18  [provider]  fluticasone (FLONASE) 50  MCG/ACT nasal spray Place 1 spray into both nostrils 2 (two) times daily. 12/09/12 11/08/18  Allena Katz H, PA-C  montelukast (SINGULAIR) 5 MG chewable tablet Chew 5 mg by mouth at bedtime.  11/08/18  [provider]    Family History Family History  Problem Relation Age of Onset   Healthy Mother    Healthy Father     Social History Social History   Tobacco Use   Smoking status: Former    Types: Cigarettes   Smokeless tobacco: Never  Vaping Use   Vaping Use: Never used  Substance Use Topics   Alcohol use: No   Drug use: Not Currently    Frequency: 7.0 times per week    Types: Marijuana     Allergies   Other, Peanut-containing drug products, and Shellfish allergy   Review of Systems Review of Systems  Constitutional:  Negative for activity change, appetite change, fatigue and fever.  Gastrointestinal:  Negative for abdominal pain, diarrhea, nausea and vomiting.  Musculoskeletal:  Negative for arthralgias and myalgias.  Skin:  Positive for rash.     Physical Exam Triage Vital Signs ED Triage Vitals  Enc Vitals Group     BP 01/16/22 0826 (!) 143/81     Pulse Rate 01/16/22 0826 66     Resp 01/16/22 0826 12     Temp 01/16/22 0826 98.3 F (36.8 C)  Temp Source 01/16/22 0826 Oral     SpO2 01/16/22 0826 100 %     Weight --      Height --      Head Circumference --      Peak Flow --      Pain Score 01/16/22 0829 0     Pain Loc --      Pain Edu? --      Excl. in [place]? --    No data found.  Updated Vital Signs BP (!) 143/81 (BP Location: Left Arm)   Pulse 66   Temp 98.3 F (36.8 C) (Oral)   Resp 12   SpO2 100%   Visual Acuity Right Eye Distance:   Left Eye Distance:   Bilateral Distance:    Right Eye Near:   Left Eye Near:    Bilateral Near:     Physical Exam Vitals reviewed.  Constitutional:      General: He is awake.     Appearance: Normal appearance. He is well-developed. He is not ill-appearing.     Comments: Very pleasant male  appears stated age in no acute distress sitting comfortably in exam room  HENT:     Head: Normocephalic and atraumatic.     Mouth/Throat:     Pharynx: Uvula midline. No oropharyngeal exudate or posterior oropharyngeal erythema.  Cardiovascular:     Rate and Rhythm: Normal rate and regular rhythm.     Heart sounds: Normal heart sounds, S1 normal and S2 normal. No murmur heard. Pulmonary:     Effort: Pulmonary effort is normal.     Breath sounds: Normal breath sounds. No stridor. No wheezing, rhonchi or rales.     Comments: Clear to auscultation bilaterally Skin:    Findings: Rash present. Rash is scaling.     Comments: Scaling rash noted across scalp and at hairline with significant debris in hair  Neurological:     Mental Status: He is alert.  Psychiatric:        Behavior: Behavior is cooperative.      UC Treatments / Results  Labs (all labs ordered are listed, but only abnormal results are displayed) Labs Reviewed - No data to display  EKG   Radiology No results found.  Procedures Procedures (including critical care time)  Medications Ordered in UC Medications - No data to display  Initial Impression / Assessment and Plan / UC Course  I have reviewed the triage vital signs and the nursing notes.  Pertinent labs & imaging results that were available during my care of the patient were reviewed by me and considered in my medical decision making (see chart for details).     Given severity of symptoms will treat with both ketoconazole shampoo as well as topical steroids.  He was encouraged to use ketoconazole shampoo and leave this on for 5 to 10 minutes after application in order to allow complete penetration through here and into scalp.  Discussed that if his symptoms are not improving with medication regimen he should follow-up with a dermatologist.  If anything worsens or changes and he has worsening rash, fever, wounds or evidence of infection he needs to be seen  immediately.  Strict return precautions given.  Patient declined work excuse note.  Final Clinical Impressions(s) / UC Diagnoses   Final diagnoses:  Seborrheic dermatitis of scalp     Discharge Instructions      Use ketoconazole shampoo twice weekly.  Make sure to leave this on for 5 to 10 minutes  after applying to allow it to completely penetrate into the scalp.  Use halobetasol lotion twice a week.  Make sure to keep area clean.  Use hypoallergenic soaps and detergents.  If your symptoms or not improving I recommend you follow-up with a dermatologist.  If anything changes or worsens and you have worsening rash, fever, nausea, vomiting, wounds or any evidence of infection (pain, swelling, oozing) you should be seen immediately.     ED Prescriptions     Medication Sig Dispense Auth. Provider   ketoconazole (NIZORAL) 2 % shampoo Apply 1 Application topically 2 (two) times a week. 120 mL Davan Hark K, PA-C   Halobetasol Propionate 0.01 % LOTN Apply 1 Application topically 2 (two) times a week. 100 g Tyron Manetta K, PA-C      PDMP not reviewed this encounter.   Jeani Hawking, PA-C 01/16/22 [phone]

## 2022-01-16 NOTE — Discharge Instructions (Signed)
Use ketoconazole shampoo twice weekly.  Make sure to leave this on for 5 to 10 minutes after applying to allow it to completely penetrate into the scalp.  Use halobetasol lotion twice a week.  Make sure to keep area clean.  Use hypoallergenic soaps and detergents.  If your symptoms or not improving I recommend you follow-up with a dermatologist.  If anything changes or worsens and you have worsening rash, fever, nausea, vomiting, wounds or any evidence of infection (pain, swelling, oozing) you should be seen immediately.

## 2022-01-16 NOTE — ED Triage Notes (Signed)
Pt is here for a rash on scalp on and off x 7yr

## 2022-01-19 ENCOUNTER — Other Ambulatory Visit: Payer: Self-pay

## 2022-01-19 ENCOUNTER — Emergency Department (HOSPITAL_COMMUNITY)
Admission: EM | Admit: 2022-01-19 | Discharge: 2022-01-19 | Discharge status: Against Medical Advice | Payer: Medicaid Other | Attending: Emergency Medicine | Admitting: Emergency Medicine

## 2022-01-19 DIAGNOSIS — U071 COVID-19: Secondary | ICD-10-CM | POA: Diagnosis not present

## 2022-01-19 DIAGNOSIS — Z5321 Procedure and treatment not carried out due to patient leaving prior to being seen by health care provider: Secondary | ICD-10-CM | POA: Diagnosis not present

## 2022-01-19 LAB — RESP PANEL BY RT-PCR (RSV, FLU A&B, COVID)  RVPGX2
Influenza A by PCR: NEGATIVE
Influenza B by PCR: NEGATIVE
Resp Syncytial Virus by PCR: NEGATIVE
SARS Coronavirus 2 by RT PCR: POSITIVE — AB

## 2022-01-19 NOTE — ED Triage Notes (Signed)
Pt. Stated, I feel bad all over, this started yesterday evening.

## 2022-08-16 ENCOUNTER — Encounter (HOSPITAL_COMMUNITY): Payer: Self-pay

## 2022-08-16 ENCOUNTER — Ambulatory Visit (HOSPITAL_COMMUNITY)
Admission: EM | Admit: 2022-08-16 | Discharge: 2022-08-16 | Disposition: A | Discharge status: Home / Self Care | Payer: Commercial Managed Care - HMO | Attending: Nurse Practitioner | Admitting: Nurse Practitioner

## 2022-08-16 DIAGNOSIS — J029 Acute pharyngitis, unspecified: Secondary | ICD-10-CM | POA: Insufficient documentation

## 2022-08-16 DIAGNOSIS — R590 Localized enlarged lymph nodes: Secondary | ICD-10-CM | POA: Diagnosis not present

## 2022-08-16 LAB — POCT RAPID STREP A (OFFICE): Rapid Strep A Screen: NEGATIVE

## 2022-08-16 LAB — POCT MONO SCREEN (KUC): Mono, POC: NEGATIVE

## 2022-08-16 MED ORDER — LIDOCAINE VISCOUS HCL 2 % MT SOLN: 15.0000 mL | OROMUCOSAL | 0 refills | Status: AC | PRN

## 2022-08-16 MED ORDER — AMOXICILLIN 500 MG PO CAPS: 500.0000 mg | ORAL_CAPSULE | Freq: Two times a day (BID) | ORAL | 0 refills | Status: AC

## 2022-08-16 NOTE — ED Provider Notes (Signed)
[facility]    CSN: [id number] Arrival date & time: 08/16/22  0815      History   Chief Complaint Chief Complaint  Patient presents with   Sore Throat   Otalgia   Headache    HPI Steven Hood is a 22 y.o. male.   Patient presents today with 1 week history of bodyaches, stuffy nose, headache, bilateral ear pressure, and sore throat that acutely worsened this morning when he woke up.  Reports his uvula feels swollen and red.  Also has had a decreased appetite and has been difficult to swallow today because of the sore throat.  No known fevers, cough, shortness of breath or chest pain, runny nose, abdominal pain, nausea, vomiting, or loss of taste/smell.  Energy levels have been normal per his report.  No known sick contacts.  Has taken Tylenol with minimal improvement in symptoms.  Patient reports history of strep throat when he was 22 years old.  Denies antibiotic use in the past 90 days.    Past Medical History:  Diagnosis Date   Asthma    Environmental allergies    Seasonal allergies     Patient Active Problem List   Diagnosis Date Noted   ASTHMA 08/20/2006    Past Surgical History:  Procedure Laterality Date   TYMPANOSTOMY TUBE PLACEMENT         Home Medications    Prior to Admission medications   Medication Sig Start Date End Date Taking? Authorizing Provider  amoxicillin (AMOXIL) 500 MG capsule Take 1 capsule (500 mg total) by mouth 2 (two) times daily for 10 days. 08/16/22 08/26/22 Yes Cathlean Marseilles A, NP  lidocaine (XYLOCAINE) 2 % solution Use as directed 15 mLs in the mouth or throat every 3 (three) hours as needed for mouth pain. Gargle and spit as needed for throat pain. 08/16/22  Yes Valentino Nose, NP  Halobetasol Propionate 0.01 % LOTN Apply 1 Application topically 2 (two) times a week. 01/16/22   Raspet, Noberto Retort, PA-C  ibuprofen (ADVIL,MOTRIN) 400 MG tablet Take 1 tablet (400 mg total) by mouth every 6 (six) hours as needed for pain.  06/15/12   Elwin Mocha, MD  ketoconazole (NIZORAL) 2 % shampoo Apply 1 Application topically 2 (two) times a week. 01/16/22   Raspet, Erin K, PA-C  albuterol (PROVENTIL HFA;VENTOLIN HFA) 108 (90 BASE) MCG/ACT inhaler Inhale 2 puffs into the lungs every 6 (six) hours as needed for wheezing.  11/08/18  [provider]  fluticasone (FLONASE) 50 MCG/ACT nasal spray Place 1 spray into both nostrils 2 (two) times daily. 12/09/12 11/08/18  Autumn Messing H, PA-C  montelukast (SINGULAIR) 5 MG chewable tablet Chew 5 mg by mouth at bedtime.  11/08/18  [provider]    Family History Family History  Problem Relation Age of Onset   Healthy Mother    Healthy Father     Social History Social History   Tobacco Use   Smoking status: Never   Smokeless tobacco: Never  Vaping Use   Vaping status: Never Used  Substance Use Topics   Alcohol use: No   Drug use: Yes    Types: Marijuana     Allergies   Other, Peanut-containing drug products, and Shellfish allergy   Review of Systems Review of Systems Per HPI  Physical Exam Triage Vital Signs ED Triage Vitals  Encounter Vitals Group     BP 08/16/22 0906 138/84     Systolic BP Percentile --  Diastolic BP Percentile --      Pulse Rate 08/16/22 0906 85     Resp 08/16/22 0906 16     Temp 08/16/22 0906 97.9 F (36.6 C)     Temp Source 08/16/22 0906 Oral     SpO2 08/16/22 0906 96 %     Weight --      Height --      Head Circumference --      Peak Flow --      Pain Score 08/16/22 0908 8     Pain Loc --      Pain Education --      Exclude from Growth Chart --    No data found.  Updated Vital Signs BP 138/84 (BP Location: Left Arm)   Pulse 85   Temp 97.9 F (36.6 C) (Oral)   Resp 16   SpO2 96%   Visual Acuity Right Eye Distance:   Left Eye Distance:   Bilateral Distance:    Right Eye Near:   Left Eye Near:    Bilateral Near:     Physical Exam Vitals and nursing note reviewed.  Constitutional:       General: He is not in acute distress.    Appearance: He is well-developed. He is not ill-appearing, toxic-appearing or diaphoretic.  HENT:     Head: Normocephalic and atraumatic.     Right Ear: Tympanic membrane and ear canal normal. No drainage, swelling or tenderness. No middle ear effusion. Tympanic membrane is not erythematous.     Left Ear: Ear canal normal. No drainage, swelling or tenderness. A middle ear effusion is present. Tympanic membrane is not erythematous.     Nose: No congestion or rhinorrhea.     Mouth/Throat:     Mouth: Mucous membranes are dry.     Pharynx: Posterior oropharyngeal erythema present. No uvula swelling.     Tonsils: No tonsillar exudate or tonsillar abscesses. 2+ on the right. 2+ on the left.     Comments: Petechiae posterior pharynx Eyes:     Conjunctiva/sclera: Conjunctivae normal.  Cardiovascular:     Rate and Rhythm: Normal rate and regular rhythm.  Pulmonary:     Effort: Pulmonary effort is normal. No respiratory distress.     Breath sounds: Normal breath sounds. No wheezing, rhonchi or rales.  Musculoskeletal:     Cervical back: Neck supple.  Lymphadenopathy:     Cervical: Cervical adenopathy present.  Skin:    General: Skin is warm and dry.     Coloration: Skin is not pale.     Findings: No erythema or rash.  Neurological:     Mental Status: He is alert and oriented to person, place, and time.      UC Treatments / Results  Labs (all labs ordered are listed, but only abnormal results are displayed) Labs Reviewed  CULTURE, GROUP A STREP [facility])  POCT RAPID STREP A (OFFICE)  POCT MONO SCREEN [facility])    EKG   Radiology No results found.  Procedures Procedures (including critical care time)  Medications Ordered in UC Medications - No data to display  Initial Impression / Assessment and Plan / UC Course  I have reviewed the triage vital signs and the nursing notes.  Pertinent labs & imaging results that were available during my  care of the patient were reviewed by me and considered in my medical decision making (see chart for details).   Patient is well-appearing, normotensive, afebrile, not tachycardic, not tachypneic, oxygenating well on room  air.    1. Acute pharyngitis, unspecified etiology 2. Cervical lymphadenopathy Rapid strep negative, Monospot negative Centor score today 2 Throat culture pending Will treat empirically for strep throat with amoxicillin twice daily for 10 days Change toothbrush after starting treatment Supportive care discussed including lidocaine rinses Note given for work Strict ER and return precautions discussed with patient  The patient was given the opportunity to ask questions.  All questions answered to their satisfaction.  The patient is in agreement to this plan.    Final Clinical Impressions(s) / UC Diagnoses   Final diagnoses:  Acute pharyngitis, unspecified etiology  Cervical lymphadenopathy     Discharge Instructions      The strep throat and mono test are both negative today.  We are sending throat test out for culture to check for strep throat.  In the meantime, start taking the amoxicillin to treat strep throat.  Change your toothbrush after starting treatment to prevent reinfection.    You can use the lidocaine rinses to help with the sore throat.  Continue Motrin/Tylenol as needed for pain.     ED Prescriptions     Medication Sig Dispense Auth. Provider   amoxicillin (AMOXIL) 500 MG capsule Take 1 capsule (500 mg total) by mouth 2 (two) times daily for 10 days. 20 capsule Cathlean Marseilles A, NP   lidocaine (XYLOCAINE) 2 % solution Use as directed 15 mLs in the mouth or throat every 3 (three) hours as needed for mouth pain. Gargle and spit as needed for throat pain. 100 mL Valentino Nose, NP      PDMP not reviewed this encounter.   Valentino Nose, NP 08/16/22 1026

## 2022-08-16 NOTE — ED Triage Notes (Signed)
Patient reports sore throat, and mild bilateral ear pain and an intermittent headache in the past week.  Patient states he has been taking Tylenol for his symptoms and the last day was 2 days ago.

## 2022-08-16 NOTE — Discharge Instructions (Addendum)
The strep throat and mono test are both negative today.  We are sending throat test out for culture to check for strep throat.  In the meantime, start taking the amoxicillin to treat strep throat.  Change your toothbrush after starting treatment to prevent reinfection.    You can use the lidocaine rinses to help with the sore throat.  Continue Motrin/Tylenol as needed for pain.

## 2023-07-13 ENCOUNTER — Inpatient Hospital Stay (HOSPITAL_COMMUNITY)

## 2023-07-13 ENCOUNTER — Encounter (HOSPITAL_COMMUNITY): Payer: Self-pay | Admitting: Internal Medicine

## 2023-07-13 ENCOUNTER — Other Ambulatory Visit: Payer: Self-pay

## 2023-07-13 ENCOUNTER — Inpatient Hospital Stay (HOSPITAL_COMMUNITY)
Admission: EM | Admit: 2023-07-13 | Discharge: 2023-08-10 | DRG: 917 | Disposition: E | Attending: Internal Medicine | Admitting: Internal Medicine

## 2023-07-13 ENCOUNTER — Emergency Department (HOSPITAL_COMMUNITY)

## 2023-07-13 DIAGNOSIS — J9601 Acute respiratory failure with hypoxia: Secondary | ICD-10-CM | POA: Diagnosis not present

## 2023-07-13 DIAGNOSIS — F1110 Opioid abuse, uncomplicated: Secondary | ICD-10-CM | POA: Diagnosis present

## 2023-07-13 DIAGNOSIS — F1410 Cocaine abuse, uncomplicated: Secondary | ICD-10-CM | POA: Diagnosis present

## 2023-07-13 DIAGNOSIS — J45909 Unspecified asthma, uncomplicated: Secondary | ICD-10-CM | POA: Diagnosis present

## 2023-07-13 DIAGNOSIS — I5A Non-ischemic myocardial injury (non-traumatic): Secondary | ICD-10-CM | POA: Diagnosis present

## 2023-07-13 DIAGNOSIS — M6282 Rhabdomyolysis: Secondary | ICD-10-CM | POA: Diagnosis not present

## 2023-07-13 DIAGNOSIS — G931 Anoxic brain damage, not elsewhere classified: Secondary | ICD-10-CM | POA: Diagnosis present

## 2023-07-13 DIAGNOSIS — Z515 Encounter for palliative care: Secondary | ICD-10-CM

## 2023-07-13 DIAGNOSIS — E871 Hypo-osmolality and hyponatremia: Secondary | ICD-10-CM | POA: Diagnosis not present

## 2023-07-13 DIAGNOSIS — G9382 Brain death: Secondary | ICD-10-CM | POA: Diagnosis not present

## 2023-07-13 DIAGNOSIS — D649 Anemia, unspecified: Secondary | ICD-10-CM | POA: Diagnosis present

## 2023-07-13 DIAGNOSIS — R569 Unspecified convulsions: Secondary | ICD-10-CM | POA: Diagnosis present

## 2023-07-13 DIAGNOSIS — J80 Acute respiratory distress syndrome: Secondary | ICD-10-CM | POA: Diagnosis present

## 2023-07-13 DIAGNOSIS — T402X1A Poisoning by other opioids, accidental (unintentional), initial encounter: Secondary | ICD-10-CM | POA: Diagnosis present

## 2023-07-13 DIAGNOSIS — R680 Hypothermia, not associated with low environmental temperature: Secondary | ICD-10-CM | POA: Diagnosis present

## 2023-07-13 DIAGNOSIS — E872 Acidosis, unspecified: Secondary | ICD-10-CM | POA: Diagnosis present

## 2023-07-13 DIAGNOSIS — Z7189 Other specified counseling: Secondary | ICD-10-CM | POA: Diagnosis not present

## 2023-07-13 DIAGNOSIS — I429 Cardiomyopathy, unspecified: Secondary | ICD-10-CM | POA: Diagnosis present

## 2023-07-13 DIAGNOSIS — I469 Cardiac arrest, cause unspecified: Principal | ICD-10-CM

## 2023-07-13 DIAGNOSIS — I5021 Acute systolic (congestive) heart failure: Secondary | ICD-10-CM | POA: Diagnosis present

## 2023-07-13 DIAGNOSIS — E870 Hyperosmolality and hypernatremia: Secondary | ICD-10-CM | POA: Diagnosis present

## 2023-07-13 DIAGNOSIS — K7200 Acute and subacute hepatic failure without coma: Secondary | ICD-10-CM | POA: Diagnosis not present

## 2023-07-13 DIAGNOSIS — J690 Pneumonitis due to inhalation of food and vomit: Secondary | ICD-10-CM | POA: Diagnosis present

## 2023-07-13 DIAGNOSIS — I2489 Other forms of acute ischemic heart disease: Secondary | ICD-10-CM | POA: Diagnosis present

## 2023-07-13 DIAGNOSIS — J9602 Acute respiratory failure with hypercapnia: Secondary | ICD-10-CM

## 2023-07-13 DIAGNOSIS — T40711A Poisoning by cannabis, accidental (unintentional), initial encounter: Secondary | ICD-10-CM | POA: Diagnosis present

## 2023-07-13 DIAGNOSIS — T405X1A Poisoning by cocaine, accidental (unintentional), initial encounter: Secondary | ICD-10-CM | POA: Diagnosis present

## 2023-07-13 DIAGNOSIS — E162 Hypoglycemia, unspecified: Secondary | ICD-10-CM | POA: Diagnosis present

## 2023-07-13 DIAGNOSIS — I468 Cardiac arrest due to other underlying condition: Secondary | ICD-10-CM | POA: Diagnosis present

## 2023-07-13 DIAGNOSIS — R579 Shock, unspecified: Secondary | ICD-10-CM | POA: Diagnosis present

## 2023-07-13 DIAGNOSIS — Z91013 Allergy to seafood: Secondary | ICD-10-CM

## 2023-07-13 DIAGNOSIS — I5020 Unspecified systolic (congestive) heart failure: Secondary | ICD-10-CM | POA: Diagnosis not present

## 2023-07-13 DIAGNOSIS — R7401 Elevation of levels of liver transaminase levels: Secondary | ICD-10-CM | POA: Diagnosis not present

## 2023-07-13 DIAGNOSIS — N179 Acute kidney failure, unspecified: Secondary | ICD-10-CM | POA: Diagnosis present

## 2023-07-13 DIAGNOSIS — D696 Thrombocytopenia, unspecified: Secondary | ICD-10-CM | POA: Diagnosis present

## 2023-07-13 DIAGNOSIS — Z66 Do not resuscitate: Secondary | ICD-10-CM | POA: Diagnosis not present

## 2023-07-13 DIAGNOSIS — R7989 Other specified abnormal findings of blood chemistry: Secondary | ICD-10-CM | POA: Diagnosis not present

## 2023-07-13 DIAGNOSIS — E876 Hypokalemia: Secondary | ICD-10-CM | POA: Diagnosis not present

## 2023-07-13 DIAGNOSIS — R001 Bradycardia, unspecified: Secondary | ICD-10-CM | POA: Diagnosis present

## 2023-07-13 DIAGNOSIS — E875 Hyperkalemia: Secondary | ICD-10-CM | POA: Diagnosis present

## 2023-07-13 DIAGNOSIS — F1210 Cannabis abuse, uncomplicated: Secondary | ICD-10-CM | POA: Diagnosis present

## 2023-07-13 DIAGNOSIS — Z91010 Allergy to peanuts: Secondary | ICD-10-CM

## 2023-07-13 HISTORY — DX: Other psychoactive substance abuse, uncomplicated: F19.10

## 2023-07-13 HISTORY — DX: Opioid abuse, uncomplicated: F11.10

## 2023-07-13 HISTORY — DX: Cocaine abuse, uncomplicated: F14.10

## 2023-07-13 HISTORY — DX: Unspecified asthma, uncomplicated: J45.909

## 2023-07-13 LAB — I-STAT VENOUS BLOOD GAS, ED
Acid-Base Excess: 1 mmol/L (ref 0.0–2.0)
Acid-base deficit: 18 mmol/L — ABNORMAL HIGH (ref 0.0–2.0)
Bicarbonate: 16.8 mmol/L — ABNORMAL LOW (ref 20.0–28.0)
Bicarbonate: 29.9 mmol/L — ABNORMAL HIGH (ref 20.0–28.0)
Calcium, Ion: 1.02 mmol/L — ABNORMAL LOW (ref 1.15–1.40)
Calcium, Ion: >2.5 mmol/L (ref 1.15–1.40)
HCT: 41 % (ref 39.0–52.0)
HCT: 35 % — ABNORMAL LOW (ref 39.0–52.0)
Hemoglobin: 13.9 g/dL (ref 13.0–17.0)
Hemoglobin: 11.9 g/dL — ABNORMAL LOW (ref 13.0–17.0)
O2 Saturation: 94 %
O2 Saturation: 84 %
Potassium: 5.4 mmol/L — ABNORMAL HIGH (ref 3.5–5.1)
Potassium: 5.6 mmol/L — ABNORMAL HIGH (ref 3.5–5.1)
Sodium: 143 mmol/L (ref 135–145)
Sodium: 147 mmol/L — ABNORMAL HIGH (ref 135–145)
TCO2: 19 mmol/L — ABNORMAL LOW (ref 22–32)
TCO2: 32 mmol/L (ref 22–32)
pCO2, Ven: 89.2 mmHg (ref 44–60)
pCO2, Ven: 67 mmHg — ABNORMAL HIGH (ref 44–60)
pH, Ven: 6.883 — CL (ref 7.25–7.43)
pH, Ven: 7.257 (ref 7.25–7.43)
pO2, Ven: 123 mmHg — ABNORMAL HIGH (ref 32–45)
pO2, Ven: 57 mmHg — ABNORMAL HIGH (ref 32–45)

## 2023-07-13 LAB — GLUCOSE, CAPILLARY
Glucose-Capillary: 182 mg/dL — ABNORMAL HIGH (ref 70–99)
Glucose-Capillary: 312 mg/dL — ABNORMAL HIGH (ref 70–99)
Glucose-Capillary: 351 mg/dL — ABNORMAL HIGH (ref 70–99)
Glucose-Capillary: 359 mg/dL — ABNORMAL HIGH (ref 70–99)

## 2023-07-13 LAB — TYPE AND SCREEN
ABO/RH(D): O POS
Antibody Screen: NEGATIVE

## 2023-07-13 LAB — URINALYSIS, ROUTINE W REFLEX MICROSCOPIC
Bilirubin Urine: NEGATIVE
Glucose, UA: 150 mg/dL — AB
Hgb urine dipstick: NEGATIVE
Ketones, ur: NEGATIVE mg/dL
Leukocytes,Ua: NEGATIVE
Nitrite: NEGATIVE
Protein, ur: NEGATIVE mg/dL
Specific Gravity, Urine: 1.016 (ref 1.005–1.030)
pH: 5 (ref 5.0–8.0)

## 2023-07-13 LAB — I-STAT CHEM 8, ED
BUN: 24 mg/dL — ABNORMAL HIGH (ref 6–20)
BUN: 24 mg/dL — ABNORMAL HIGH (ref 6–20)
Calcium, Ion: 1.03 mmol/L — ABNORMAL LOW (ref 1.15–1.40)
Calcium, Ion: >2.5 mmol/L (ref 1.15–1.40)
Chloride: 110 mmol/L (ref 98–111)
Chloride: 115 mmol/L — ABNORMAL HIGH (ref 98–111)
Creatinine, Ser: 2 mg/dL — ABNORMAL HIGH (ref 0.61–1.24)
Creatinine, Ser: 2.3 mg/dL — ABNORMAL HIGH (ref 0.61–1.24)
Glucose, Bld: 39 mg/dL — CL (ref 70–99)
Glucose, Bld: 38 mg/dL — CL (ref 70–99)
HCT: 42 % (ref 39.0–52.0)
HCT: 35 % — ABNORMAL LOW (ref 39.0–52.0)
Hemoglobin: 14.3 g/dL (ref 13.0–17.0)
Hemoglobin: 11.9 g/dL — ABNORMAL LOW (ref 13.0–17.0)
Potassium: 5.5 mmol/L — ABNORMAL HIGH (ref 3.5–5.1)
Potassium: 5.6 mmol/L — ABNORMAL HIGH (ref 3.5–5.1)
Sodium: 144 mmol/L (ref 135–145)
Sodium: 147 mmol/L — ABNORMAL HIGH (ref 135–145)
TCO2: 19 mmol/L — ABNORMAL LOW (ref 22–32)
TCO2: 29 mmol/L (ref 22–32)

## 2023-07-13 LAB — I-STAT ARTERIAL BLOOD GAS, ED
Acid-base deficit: 16 mmol/L — ABNORMAL HIGH (ref 0.0–2.0)
Bicarbonate: 16.6 mmol/L — ABNORMAL LOW (ref 20.0–28.0)
Calcium, Ion: 1.09 mmol/L — ABNORMAL LOW (ref 1.15–1.40)
HCT: 41 % (ref 39.0–52.0)
Hemoglobin: 13.9 g/dL (ref 13.0–17.0)
O2 Saturation: 100 %
Patient temperature: 87
Potassium: 6 mmol/L — ABNORMAL HIGH (ref 3.5–5.1)
Sodium: 142 mmol/L (ref 135–145)
TCO2: 19 mmol/L — ABNORMAL LOW (ref 22–32)
pCO2 arterial: 53.1 mmHg — ABNORMAL HIGH (ref 32–48)
pH, Arterial: 7.059 — CL (ref 7.35–7.45)
pO2, Arterial: 460 mmHg — ABNORMAL HIGH (ref 83–108)

## 2023-07-13 LAB — CBC WITH DIFFERENTIAL/PLATELET
Abs Immature Granulocytes: 0 K/uL (ref 0.00–0.07)
Basophils Absolute: 0 K/uL (ref 0.0–0.1)
Basophils Relative: 0 %
Eosinophils Absolute: 0 K/uL (ref 0.0–0.5)
Eosinophils Relative: 0 %
HCT: 45.6 % (ref 39.0–52.0)
Hemoglobin: 13.4 g/dL (ref 13.0–17.0)
Lymphocytes Relative: 24 %
Lymphs Abs: 3.5 K/uL (ref 0.7–4.0)
MCH: 30 pg (ref 26.0–34.0)
MCHC: 29.4 g/dL — ABNORMAL LOW (ref 30.0–36.0)
MCV: 102.2 fL — ABNORMAL HIGH (ref 80.0–100.0)
Monocytes Absolute: 2.6 K/uL — ABNORMAL HIGH (ref 0.1–1.0)
Monocytes Relative: 18 %
Neutro Abs: 8.5 K/uL — ABNORMAL HIGH (ref 1.7–7.7)
Neutrophils Relative %: 58 %
Platelets: 299 K/uL (ref 150–400)
RBC: 4.46 MIL/uL (ref 4.22–5.81)
RDW: 13.1 % (ref 11.5–15.5)
WBC: 14.6 K/uL — ABNORMAL HIGH (ref 4.0–10.5)
nRBC: 0.4 % — ABNORMAL HIGH (ref 0.0–0.2)
nRBC: 2 /100{WBCs} — ABNORMAL HIGH

## 2023-07-13 LAB — COMPREHENSIVE METABOLIC PANEL WITH GFR
ALT: 1450 U/L — ABNORMAL HIGH (ref 0–44)
AST: 924 U/L — ABNORMAL HIGH (ref 15–41)
Albumin: 3 g/dL — ABNORMAL LOW (ref 3.5–5.0)
Alkaline Phosphatase: 80 U/L (ref 38–126)
Anion gap: 23 — ABNORMAL HIGH (ref 5–15)
BUN: 18 mg/dL (ref 6–20)
CO2: 18 mmol/L — ABNORMAL LOW (ref 22–32)
Calcium: 8.2 mg/dL — ABNORMAL LOW (ref 8.9–10.3)
Chloride: 106 mmol/L (ref 98–111)
Creatinine, Ser: 2.25 mg/dL — ABNORMAL HIGH (ref 0.61–1.24)
GFR, Estimated: 41 mL/min — ABNORMAL LOW (ref >60)
Glucose, Bld: 39 mg/dL — CL (ref 70–99)
Potassium: 5.7 mmol/L — ABNORMAL HIGH (ref 3.5–5.1)
Sodium: 147 mmol/L — ABNORMAL HIGH (ref 135–145)
Total Bilirubin: 0.7 mg/dL (ref 0.0–1.2)
Total Protein: 5.5 g/dL — ABNORMAL LOW (ref 6.5–8.1)

## 2023-07-13 LAB — RAPID URINE DRUG SCREEN, HOSP PERFORMED
Amphetamines: NOT DETECTED
Barbiturates: NOT DETECTED
Benzodiazepines: NOT DETECTED
Cocaine: POSITIVE — AB
Opiates: NOT DETECTED
Tetrahydrocannabinol: POSITIVE — AB

## 2023-07-13 LAB — TROPONIN I (HIGH SENSITIVITY)
Troponin I (High Sensitivity): 1277 ng/L (ref <18)
Troponin I (High Sensitivity): 2777 ng/L (ref <18)

## 2023-07-13 LAB — SALICYLATE LEVEL: Salicylate Lvl: <7 mg/dL — ABNORMAL LOW (ref 7.0–30.0)

## 2023-07-13 LAB — ETHANOL: Alcohol, Ethyl (B): <15 mg/dL (ref <15)

## 2023-07-13 LAB — HEMOGLOBIN A1C
Hgb A1c MFr Bld: 5.1 % (ref 4.8–5.6)
Mean Plasma Glucose: 99.67 mg/dL

## 2023-07-13 LAB — HIV ANTIBODY (ROUTINE TESTING W REFLEX): HIV Screen 4th Generation wRfx: NONREACTIVE

## 2023-07-13 LAB — LACTIC ACID, PLASMA
Lactic Acid, Venous: >9 mmol/L (ref 0.5–1.9)
Lactic Acid, Venous: 6.8 mmol/L (ref 0.5–1.9)
Lactic Acid, Venous: 6.2 mmol/L (ref 0.5–1.9)

## 2023-07-13 LAB — ACETAMINOPHEN LEVEL: Acetaminophen (Tylenol), Serum: <10 ug/mL — ABNORMAL LOW (ref 10–30)

## 2023-07-13 LAB — ABO/RH: ABO/RH(D): O POS

## 2023-07-13 LAB — MRSA NEXT GEN BY PCR, NASAL: MRSA by PCR Next Gen: DETECTED — AB

## 2023-07-13 MED ORDER — EPINEPHRINE 1 MG/10ML IJ SOSY
PREFILLED_SYRINGE | INTRAMUSCULAR | Status: AC | PRN
  Administered 2023-07-13: 1 mg via INTRAVENOUS

## 2023-07-13 MED ORDER — ENOXAPARIN SODIUM 40 MG/0.4ML IJ SOSY
40.0000 mg | PREFILLED_SYRINGE | INTRAMUSCULAR | Status: DC
  Administered 2023-07-13 – 2023-07-15 (×3): 40 mg via SUBCUTANEOUS
  Filled 2023-07-13 (×3): qty 0.4

## 2023-07-13 MED ORDER — FUROSEMIDE 10 MG/ML IJ SOLN
INTRAMUSCULAR | Status: AC
  Filled 2023-07-13: qty 8

## 2023-07-13 MED ORDER — ACETAMINOPHEN 160 MG/5ML PO SOLN: 650.0000 mg | ORAL | Status: DC

## 2023-07-13 MED ORDER — CHLORHEXIDINE GLUCONATE CLOTH 2 % EX PADS: 6.0000 | MEDICATED_PAD | Freq: Every day | CUTANEOUS | Status: DC

## 2023-07-13 MED ORDER — FENTANYL CITRATE PF 50 MCG/ML IJ SOSY
50.0000 ug | PREFILLED_SYRINGE | INTRAMUSCULAR | Status: AC | PRN
  Administered 2023-07-14 (×3): 50 ug via INTRAVENOUS
  Filled 2023-07-13 (×3): qty 1

## 2023-07-13 MED ORDER — PIPERACILLIN-TAZOBACTAM 3.375 G IVPB
3.3750 g | Freq: Three times a day (TID) | INTRAVENOUS | Status: DC
  Administered 2023-07-13 – 2023-07-16 (×8): 3.375 g via INTRAVENOUS
  Filled 2023-07-13 (×9): qty 50

## 2023-07-13 MED ORDER — SODIUM BICARBONATE 8.4 % IV SOLN: 100.0000 meq | INTRAVENOUS | Status: AC

## 2023-07-13 MED ORDER — MUPIROCIN 2 % EX OINT
1.0000 | TOPICAL_OINTMENT | Freq: Two times a day (BID) | CUTANEOUS | Status: DC
  Administered 2023-07-13 – 2023-07-16 (×5): 1 via NASAL
  Filled 2023-07-13 (×2): qty 22

## 2023-07-13 MED ORDER — ACETAMINOPHEN 325 MG PO TABS: 650.0000 mg | ORAL_TABLET | ORAL | Status: DC

## 2023-07-13 MED ORDER — ROCURONIUM BROMIDE 10 MG/ML (PF) SYRINGE
PREFILLED_SYRINGE | INTRAVENOUS | Status: AC
  Administered 2023-07-13: 100 mg via INTRAVENOUS
  Filled 2023-07-13: qty 10

## 2023-07-13 MED ORDER — FENTANYL CITRATE PF 50 MCG/ML IJ SOSY
50.0000 ug | PREFILLED_SYRINGE | INTRAMUSCULAR | Status: DC | PRN
  Administered 2023-07-14: 50 ug via INTRAVENOUS
  Filled 2023-07-13: qty 1

## 2023-07-13 MED ORDER — VASOPRESSIN 20 UNITS/100 ML INFUSION FOR SHOCK
0.0000 [IU]/min | INTRAVENOUS | Status: DC
  Administered 2023-07-13: 0.03 [IU]/min via INTRAVENOUS
  Administered 2023-07-13 – 2023-07-14 (×4): 0.04 [IU]/min via INTRAVENOUS
  Administered 2023-07-15: 0.03 [IU]/min via INTRAVENOUS
  Administered 2023-07-15: 0.02 [IU]/min via INTRAVENOUS
  Administered 2023-07-16: 0.03 [IU]/min via INTRAVENOUS
  Filled 2023-07-13 (×7): qty 100

## 2023-07-13 MED ORDER — FAMOTIDINE 20 MG PO TABS
20.0000 mg | ORAL_TABLET | Freq: Every day | ORAL | Status: DC
  Administered 2023-07-13 – 2023-07-16 (×4): 20 mg
  Filled 2023-07-13 (×4): qty 1

## 2023-07-13 MED ORDER — NOREPINEPHRINE 4 MG/250ML-% IV SOLN: 2.0000 ug/min | INTRAVENOUS | Status: DC

## 2023-07-13 MED ORDER — DOCUSATE SODIUM 100 MG PO CAPS: 100.0000 mg | ORAL_CAPSULE | Freq: Two times a day (BID) | ORAL | Status: DC | PRN

## 2023-07-13 MED ORDER — SODIUM BICARBONATE 8.4 % IV SOLN
INTRAVENOUS | Status: DC
  Filled 2023-07-13 (×3): qty 1000

## 2023-07-13 MED ORDER — ETOMIDATE 2 MG/ML IV SOLN: 20.0000 mg | INTRAVENOUS | Status: AC

## 2023-07-13 MED ORDER — PIPERACILLIN-TAZOBACTAM 3.375 G IVPB 30 MIN
3.3750 g | Freq: Once | INTRAVENOUS | Status: AC
  Administered 2023-07-13: 3.375 g via INTRAVENOUS
  Filled 2023-07-13: qty 50

## 2023-07-13 MED ORDER — NOREPINEPHRINE 4 MG/250ML-% IV SOLN
0.0000 ug/min | INTRAVENOUS | Status: DC
  Administered 2023-07-13: 19 ug/min via INTRAVENOUS
  Administered 2023-07-13: 25 ug/min via INTRAVENOUS
  Administered 2023-07-13: 5 ug/min via INTRAVENOUS
  Administered 2023-07-14: 25 ug/min via INTRAVENOUS
  Administered 2023-07-14: 18 ug/min via INTRAVENOUS
  Administered 2023-07-14: 10 ug/min via INTRAVENOUS
  Administered 2023-07-14: 17 ug/min via INTRAVENOUS
  Administered 2023-07-14: 10 ug/min via INTRAVENOUS
  Administered 2023-07-14: 11 ug/min via INTRAVENOUS
  Administered 2023-07-15: 30 ug/min via INTRAVENOUS
  Administered 2023-07-15: 40 ug/min via INTRAVENOUS
  Administered 2023-07-15 (×2): 35 ug/min via INTRAVENOUS
  Filled 2023-07-13 (×2): qty 250
  Filled 2023-07-13: qty 500
  Filled 2023-07-13 (×4): qty 250
  Filled 2023-07-13: qty 500
  Filled 2023-07-13: qty 250

## 2023-07-13 MED ORDER — ETOMIDATE 2 MG/ML IV SOLN
INTRAVENOUS | Status: AC
  Administered 2023-07-13: 20 mg via INTRAVENOUS
  Filled 2023-07-13: qty 10

## 2023-07-13 MED ORDER — ORAL CARE MOUTH RINSE: 15.0000 mL | OROMUCOSAL | Status: DC | PRN

## 2023-07-13 MED ORDER — VANCOMYCIN HCL 1750 MG/350ML IV SOLN
1750.0000 mg | Freq: Once | INTRAVENOUS | Status: AC
  Administered 2023-07-13: 1750 mg via INTRAVENOUS
  Filled 2023-07-13: qty 350

## 2023-07-13 MED ORDER — SODIUM CHLORIDE 0.9 % IV SOLN: 250.0000 mL | INTRAVENOUS | Status: AC

## 2023-07-13 MED ORDER — ROCURONIUM BROMIDE 10 MG/ML (PF) SYRINGE: 100.0000 mg | PREFILLED_SYRINGE | INTRAVENOUS | Status: AC

## 2023-07-13 MED ORDER — SODIUM BICARBONATE 8.4 % IV SOLN
INTRAVENOUS | Status: AC | PRN
  Administered 2023-07-13: 50 meq via INTRAVENOUS

## 2023-07-13 MED ORDER — DOCUSATE SODIUM 50 MG/5ML PO LIQD: 100.0000 mg | Freq: Two times a day (BID) | ORAL | Status: DC

## 2023-07-13 MED ORDER — DOCUSATE SODIUM 50 MG/5ML PO LIQD: 100.0000 mg | Freq: Two times a day (BID) | ORAL | Status: DC | PRN

## 2023-07-13 MED ORDER — MAGNESIUM SULFATE 2 GM/50ML IV SOLN: 2.0000 g | Freq: Once | INTRAVENOUS | Status: AC | PRN

## 2023-07-13 MED ORDER — EPINEPHRINE HCL 5 MG/250ML IV SOLN IN NS
0.5000 ug/min | INTRAVENOUS | Status: DC
  Administered 2023-07-13: 10 ug/min via INTRAVENOUS
  Administered 2023-07-13: 20 ug/min via INTRAVENOUS
  Administered 2023-07-14: 17 ug/min via INTRAVENOUS
  Administered 2023-07-14 (×2): 20 ug/min via INTRAVENOUS
  Administered 2023-07-14: 18 ug/min via INTRAVENOUS
  Filled 2023-07-13 (×6): qty 250

## 2023-07-13 MED ORDER — LACTATED RINGERS IV BOLUS
1000.0000 mL | Freq: Once | INTRAVENOUS | Status: AC
  Administered 2023-07-13: 1000 mL via INTRAVENOUS

## 2023-07-13 MED ORDER — SODIUM BICARBONATE 8.4 % IV SOLN
INTRAVENOUS | Status: AC
  Administered 2023-07-13: 100 meq via INTRAVENOUS
  Filled 2023-07-13: qty 100

## 2023-07-13 MED ORDER — DEXTROSE 50 % IV SOLN
1.0000 | Freq: Once | INTRAVENOUS | Status: AC
  Administered 2023-07-13: 50 mL via INTRAVENOUS

## 2023-07-13 MED ORDER — IOHEXOL 350 MG/ML SOLN
75.0000 mL | Freq: Once | INTRAVENOUS | Status: AC | PRN
  Administered 2023-07-13: 75 mL via INTRAVENOUS

## 2023-07-13 MED ORDER — ACETAMINOPHEN 650 MG RE SUPP: 650.0000 mg | RECTAL | Status: DC | PRN

## 2023-07-13 MED ORDER — CHLORHEXIDINE GLUCONATE CLOTH 2 % EX PADS
6.0000 | MEDICATED_PAD | Freq: Every day | CUTANEOUS | Status: DC
  Administered 2023-07-13 – 2023-07-15 (×3): 6 via TOPICAL

## 2023-07-13 MED ORDER — CALCIUM CHLORIDE 10 % IV SOLN
INTRAVENOUS | Status: AC | PRN
  Administered 2023-07-13 (×2): 1 g via INTRAVENOUS

## 2023-07-13 MED ORDER — FUROSEMIDE 10 MG/ML IJ SOLN: 80.0000 mg | INTRAMUSCULAR | Status: AC

## 2023-07-13 MED ORDER — INSULIN ASPART 100 UNIT/ML IJ SOLN
0.0000 [IU] | INTRAMUSCULAR | Status: DC
  Administered 2023-07-14: 2 [IU] via SUBCUTANEOUS
  Administered 2023-07-14: 15 [IU] via SUBCUTANEOUS
  Administered 2023-07-14: 3 [IU] via SUBCUTANEOUS
  Administered 2023-07-14: 2 [IU] via SUBCUTANEOUS
  Administered 2023-07-14: 5 [IU] via SUBCUTANEOUS
  Administered 2023-07-16 (×2): 2 [IU] via SUBCUTANEOUS

## 2023-07-13 MED ORDER — EPOPROSTENOL SODIUM 1.5 MG IV SOLR
50.0000 ng/kg/min | INTRAVENOUS | Status: DC
  Administered 2023-07-13 – 2023-07-16 (×11): 50 ng/kg/min via RESPIRATORY_TRACT
  Filled 2023-07-13 (×16): qty 5

## 2023-07-13 MED ORDER — FUROSEMIDE 10 MG/ML IJ SOLN
80.0000 mg | Freq: Once | INTRAMUSCULAR | Status: AC
  Administered 2023-07-13: 80 mg via INTRAVENOUS

## 2023-07-13 MED ORDER — ACETAMINOPHEN 650 MG RE SUPP: 650.0000 mg | RECTAL | Status: DC

## 2023-07-13 MED ORDER — HYDROCORTISONE SOD SUC (PF) 100 MG IJ SOLR
100.0000 mg | Freq: Two times a day (BID) | INTRAMUSCULAR | Status: DC
  Administered 2023-07-13 – 2023-07-16 (×6): 100 mg via INTRAVENOUS
  Filled 2023-07-13 (×6): qty 2

## 2023-07-13 MED ORDER — POLYETHYLENE GLYCOL 3350 17 G PO PACK: 17.0000 g | PACK | Freq: Every day | ORAL | Status: DC | PRN

## 2023-07-13 MED ORDER — SODIUM BICARBONATE 8.4 % IV SOLN
100.0000 meq | Freq: Once | INTRAVENOUS | Status: AC
  Administered 2023-07-13: 100 meq via INTRAVENOUS
  Filled 2023-07-13: qty 100
  Filled 2023-07-13: qty 50

## 2023-07-13 MED ORDER — POLYETHYLENE GLYCOL 3350 17 G PO PACK: 17.0000 g | PACK | Freq: Every day | ORAL | Status: DC

## 2023-07-13 MED ORDER — BUSPIRONE HCL 15 MG PO TABS
30.0000 mg | ORAL_TABLET | Freq: Three times a day (TID) | ORAL | Status: AC | PRN
  Administered 2023-07-14: 30 mg
  Filled 2023-07-13: qty 2

## 2023-07-13 MED ORDER — FUROSEMIDE 10 MG/ML IJ SOLN
INTRAMUSCULAR | Status: AC
  Administered 2023-07-13: 80 mg via INTRAVENOUS
  Filled 2023-07-13: qty 8

## 2023-07-13 MED ORDER — INSULIN GLARGINE-YFGN 100 UNIT/ML ~~LOC~~ SOLN
5.0000 [IU] | Freq: Every day | SUBCUTANEOUS | Status: DC
Start: 2023-07-14 — End: 2023-07-15
  Administered 2023-07-14: 5 [IU] via SUBCUTANEOUS
  Filled 2023-07-13 (×3): qty 0.05

## 2023-07-13 MED ORDER — BUSPIRONE HCL 15 MG PO TABS
30.0000 mg | ORAL_TABLET | Freq: Three times a day (TID) | ORAL | Status: AC | PRN
Start: 2023-07-13 — End: 2023-07-15
  Administered 2023-07-14: 30 mg via ORAL
  Filled 2023-07-13: qty 2

## 2023-07-13 MED ORDER — ACETAMINOPHEN 160 MG/5ML PO SOLN
650.0000 mg | ORAL | Status: DC | PRN
Start: 2023-07-15 — End: 2023-07-13

## 2023-07-13 MED ORDER — ACETAMINOPHEN 325 MG PO TABS
650.0000 mg | ORAL_TABLET | ORAL | Status: DC | PRN
Start: 2023-07-15 — End: 2023-07-13

## 2023-07-13 MED ORDER — ORAL CARE MOUTH RINSE
15.0000 mL | OROMUCOSAL | Status: DC
Start: 2023-07-14 — End: 2023-07-16
  Administered 2023-07-14 – 2023-07-16 (×29): 15 mL via OROMUCOSAL

## 2023-07-13 MED ORDER — ONDANSETRON HCL 4 MG/2ML IJ SOLN
4.0000 mg | Freq: Four times a day (QID) | INTRAMUSCULAR | Status: DC | PRN
Start: 2023-07-13 — End: 2023-07-16

## 2023-07-13 NOTE — Progress Notes (Signed)
 CT head was done showing no acute abnormalities  CT chest/abdomen/pelvis was done showing bilateral lung infiltrates likely due to aspiration right more than left.  Started on IV Zosyn   Will send respiratory culture   Valinda Novas, MD

## 2023-07-13 NOTE — ED Triage Notes (Signed)
 Pt BIB [facility] as post CPR. EMS reports he was last seen normal 30 min prior to the families call to 911 & the girl friend also reports she heard him snoring loud while sleeping this morning. EMS reports as cool as he was to touch upon their arrival there is no true known downtime. EMS reports pt was out last night drinking alcohol, smoking marijuana & taking percocets. CPR was started on scene upon EMS arrival @ 1132, was asystole on their monitor. ROSC obtained at 1148, was never in a shockable rhythm. Pt lost pulses & 3 mor minutes of CPR was performed & last ROSC obtained at 1214. He received a total of 6 Epi's, 2mg  Narcan, 2,000 L of NS & was on an Epi gtt upon arrival. Bil 18g PIV in AC's & blue IO in Rt Tib/Fib. Intubated/assisted ventilations & does have pulses upon arrival to ED.

## 2023-07-13 NOTE — Progress Notes (Signed)
 ED Pharmacy Antibiotic Sign Off An antibiotic consult was received from an ED provider for vancomycin  per pharmacy dosing for sepsis. A chart review was completed to assess appropriateness.   The following one time order(s) were placed:  Vancomycin  1750 mg IV x 1  Further antibiotic and/or antibiotic pharmacy consults should be ordered by the admitting provider if indicated.   Thank you for allowing pharmacy to be a part of this patient's care.   Dorn Poot, [facility]  Clinical Pharmacist 07/13/23 1:39 PM

## 2023-07-13 NOTE — Progress Notes (Signed)
 Transported pt from ED TRAC to CT2 and then CT2 to 3M03 with bedside RN. Vitals are stable.

## 2023-07-13 NOTE — Progress Notes (Signed)
   07/13/23 1418  Spiritual Encounters  Type of Visit Initial  Care provided to: Family  Reason for visit Code  OnCall Visit Yes   Chaplain, while on call, responded to phone page for [employer]. Chaplain visited with Pt's family in consult room and provided spiritual care, brought some refreshments, and obtain clothing of one family member that was left in Pt's room. Per Pt's family, they have no further need for spiritual care. Chaplain shared with Pt's family that chaplain is available anytime upon request.  Chaplain Therisa Samuel

## 2023-07-13 NOTE — Procedures (Signed)
 Bronchoscopy Procedure Note  Steven Hood  [id number]  [birth year]  Date:07/13/23  Time:3:40 PM   Provider Performing:Ahmari Garton   Procedure(s):  Flexible Bronchoscopy [phone]) and Initial Therapeutic Aspiration of Tracheobronchial Tree [phone])  Indication(s) Mucus Plug  Consent Risks of the procedure as well as the alternatives and risks of each were explained to the patient and/or caregiver.  Consent for the procedure was obtained and is signed in the bedside chart  Anesthesia Etomidate  and Rocuronium    Time Out Verified patient identification, verified procedure, site/side was marked, verified correct patient position, special equipment/implants available, medications/allergies/relevant history reviewed, required imaging and test results available.   Sterile Technique Usual hand hygiene, masks, gowns, and gloves were used   Procedure Description Bronchoscope advanced through endotracheal tube and into airway.  Airways were examined down to subsegmental level with findings noted below.   Following diagnostic evaluation, Therapeutic aspiration performed in B/l Lower lobes  Findings: Copious amounts of thin secretions noted throughout the airway, suctioned     Complications/Tolerance None; patient tolerated the procedure well. Chest X-ray is not needed post procedure.   EBL Minimal   Specimen(s) BAL

## 2023-07-13 NOTE — Procedures (Signed)
 Intubation Procedure Note  KHALIFA KNECHT  [id number]  08-03-2000  Date:07/13/23  Time:3:39 PM   Provider Performing:Rodnesha Elie    Procedure: Intubation (31500)  Indication(s) Respiratory Failure  Consent Risks of the procedure as well as the alternatives and risks of each were explained to the patient and/or caregiver.  Consent for the procedure was obtained and is signed in the bedside chart   Anesthesia Etomidate  and Rocuronium    Time Out Verified patient identification, verified procedure, site/side was marked, verified correct patient position, special equipment/implants available, medications/allergies/relevant history reviewed, required imaging and test results available.   Sterile Technique Usual hand hygeine, masks, and gloves were used   Procedure Description Patient positioned in bed supine.  Sedation given as noted above.  Patient was intubated with endotracheal tube using Glidescope.  View was Grade 1 full glottis .  Number of attempts was 1.  Colorimetric CO2 detector was consistent with tracheal placement.   Complications/Tolerance None; patient tolerated the procedure well. Chest X-ray is ordered to verify placement.   EBL Minimal   Specimen(s) None

## 2023-07-13 NOTE — Progress Notes (Signed)
 RT had trouble passing suction catheter through the EMS ET tube. CCM at bedside for tube exchange. Tube successfully exchanged from a 7.0 to a 7.5 without any complications. Positive color change on CO2 detector confirming correct tube placement. ETT re-secured with tube holder. RT able to pass suction catheter without any issues.

## 2023-07-13 NOTE — Progress Notes (Signed)
 RT notified CCM of critical ABG values. Changes made to vent per CCM.

## 2023-07-13 NOTE — H&P (Signed)
 NAME:  Steven Hood, MRN:  [id number], DOB:  October 03, 2000, LOS: 0 ADMISSION DATE:  07/13/2023, CONSULTATION DATE: 07/13/2023 REFERRING MD: Dr. Mannie - EDP, CHIEF COMPLAINT: Cardiac arrest  History of Present Illness:  Steven Hood is a 23 year old male with no reported past medical history who presented to the ED via EMS after being found unresponsive by family, concern for substance abuse/unintentional overdose given drug paraphernalia found per EMS.  On EMS arrival patient was found asystolic prompting immediate ACLS patient required 12 minutes of CLS protocol prior to ROSC did lose pulses again and around requiring 1 more round of ACLS before acquiring ROSC again.  On ED arrival patient was seen hypothermic with temperature 86.9 F and hypotensive.  Jorde of blood work pending at time of arrival but i-STAT chemistry reveals potassium 6.0 with creatinine 2.0.  Arterial blood gas with pH 7.0, pCO2 53, PO2 460, bicarb 16.6.  PCCM consulted for further management and admission.  Pertinent  Medical History  None  Significant Hospital Events: Including procedures, antibiotic start and stop dates in addition to other pertinent events   7/4 found unresponsive in asystole with high suspicion for drug overdose required 12 minutes of ACLS  Interim History / Subjective:  As above  Objective    Blood pressure (!) 97/52, pulse 63, temperature (!) 86.9 F (30.5 C), temperature source Core, resp. rate 12, SpO2 100%.       No intake or output data in the 24 hours ending 07/13/23 1320 There were no vitals filed for this visit.  Examination: General: Acute ill-appearing adult male lying in bed unresponsive on mechanical ventilation HEENT: ETT, MM pink/moist, PERRL,  Neuro: Unresponsive, no cough no gag, pupils sluggish CV: Bradycardia s1s2 regular rate and rhythm, no murmur, rubs, or gallops,  PULM: Clear to auscultation bilaterally, no increased work of breathing, no added breath sounds GI:  soft, bowel sounds active in all 4 quadrants, non-tender, non-distended Extremities: warm/dry, no edema  Skin: no rashes or lesions  Resolved problem list   Assessment and Plan  Out-of-hospital cardiac arrest - Original rhythm was asystole per EMS.  Suffered third arrest on my evaluation in ED.  He became progressively bradycardic with loss of pleth of A-line and on pulse check patient was pulseless round of ACLS Circulatory shock - Currently requiring 3 vasopressor supports P: Goal of normothermia Continuous telemetry CVL and A-line in place Continue vasopressor supports for MAP goal greater than 65 Start bicarb drip Check echocardiogram Trend troponin  Acute hypoxic and hypercapnic respiratory failure secondary to above P: Continue ventilator support with lung protective strategies  Wean PEEP and FiO2 for sats greater than 90%. Head of bed elevated 30 degrees. Plateau pressures less than 30 cm H20.  Follow intermittent chest x-ray and ABG.   SAT/SBT as tolerated, mentation preclude extubation  Ensure adequate pulmonary hygiene  Follow cultures  VAP bundle in place  PAD protocol  At risk for anoxic encephalopathy. - Estimated 12 minutes of downtime P: Maintain neuro protective measures; goal for eurothermia, euglycemia, eunatermia, normoxia, and PCO2 goal of 35-40 Nutrition and bowel regiment  Seizure precautions  Aspirations precautions  MRI at 72 hours EEG  Substance abuse - Girlfriend reports that they were drinking alcohol night prior to admission and the patient used to use Percocets but she is unsure if he has used recently.  She did find some unknown pills behind bed morning of admission P: CIWA protocol Monitor for signs of withdrawal UDS  Concern for acute  kidney injury - Full chemistry still pending at time of assessment but i-STAT indicates hypokalemia P: Follow renal function  Monitor urine output Trend Bmet Avoid nephrotoxins Ensure adequate  renal perfusion  IV hydration   Best Practice (right click and Reselect all SmartList Selections daily)   Diet/type: NPO DVT prophylaxis SCD Pressure ulcer(s): N/A GI prophylaxis: PPI Lines: Central line and Arterial Line Foley:  Yes, and it is still needed Code Status:  full code Last date of multidisciplinary goals of care discussion: Confirmed with patient's mother he is a full code with all aggressive interventions available  Labs   CBC: Recent Labs  Lab 07/13/23 1305 07/13/23 1316  HGB 13.9  14.3 13.9  HCT 41.0  42.0 41.0    Basic Metabolic Panel: Recent Labs  Lab 07/13/23 1305 07/13/23 1316  NA 143  144 142  K 5.4*  5.5* 6.0*  CL 110  --   GLUCOSE 39*  --   BUN 24*  --   CREATININE 2.00*  --    GFR: CrCl cannot be calculated (Unknown ideal weight.). No results for input(s): PROCALCITON, WBC, LATICACIDVEN in the last 168 hours.  Liver Function Tests: No results for input(s): AST, ALT, ALKPHOS, BILITOT, PROT, ALBUMIN in the last 168 hours. No results for input(s): LIPASE, AMYLASE in the last 168 hours. No results for input(s): AMMONIA in the last 168 hours.  ABG    Component Value Date/Time   PHART 7.059 (LL) 07/13/2023 1316   PCO2ART 53.1 (H) 07/13/2023 1316   PO2ART 460 (H) 07/13/2023 1316   HCO3 16.6 (L) 07/13/2023 1316   TCO2 19 (L) 07/13/2023 1316   ACIDBASEDEF 16.0 (H) 07/13/2023 1316   O2SAT 100 07/13/2023 1316     Coagulation Profile: No results for input(s): INR, PROTIME in the last 168 hours.  Cardiac Enzymes: No results for input(s): CKTOTAL, CKMB, CKMBINDEX, TROPONINI in the last 168 hours.  HbA1C: No results found for: HGBA1C  CBG: No results for input(s): GLUCAP in the last 168 hours.  Review of Systems:   Unable to assess  Past Medical History:  He,  has no past medical history on file.   Surgical History:     Social History:      Family History:  His family history is  not on file.   Allergies Not on File   Home Medications  Prior to Admission medications   Not on File     Critical care time:   CRITICAL CARE Performed by: Beonca Gibb D. Harris   Total critical care time: 45 minutes  Critical care time was exclusive of separately billable procedures and treating other patients.  Critical care was necessary to treat or prevent imminent or life-threatening deterioration.  Critical care was time spent personally by me on the following activities: development of treatment plan with patient and/or surrogate as well as nursing, discussions with consultants, evaluation of patient's response to treatment, examination of patient, obtaining history from patient or surrogate, ordering and performing treatments and interventions, ordering and review of laboratory studies, ordering and review of radiographic studies, pulse oximetry and re-evaluation of patient's condition.  Wilgus Deyton D. Harris, NP-C [facility] Personal contact information can be found on Amion  If no contact or response made please call [phone] 07/13/2023, 2:20 PM

## 2023-07-13 NOTE — ED Provider Notes (Addendum)
 [place] EMERGENCY DEPARTMENT AT [facility] Provider Note   CSN: [id number] Arrival date & time: 07/13/23  1242     Patient presents with: Post CPR   Steven Hood is a 23 y.o. male.   23 year old male presenting to the emergency department as a post arrest ROSC.  History obtained via EMS and patient's family.  Reportedly, 30 minutes to 1 hour prior to calling EMS, patient seemed like his usual self.  He had been out the evening before.  Family returned approximately 1 hour to 30 minutes later, patient was not responsive.  They called EMS.  EMS reports that when they arrived, patient was in asystole.  They began ACLS protocol.  They report that after 12 minutes of CPR, they obtained ROSC.  Patient was started on epinephrine  drip.  Approximately 3 minutes later, patient had another arrest that was also asystole.  They resumed chest compressions, and at the first rhythm check, patient had ROSC.  Patient's family reports that there were some pills in his room.  They are unsure what they were or if he took them.        Prior to Admission medications   Not on File    Allergies: Patient has no known allergies.    Review of Systems  Updated Vital Signs BP 126/70   Pulse 62   Temp (!) 86.9 F (30.5 C) (Core)   Resp (!) 9   Ht 5' 10 (1.778 m)   SpO2 92%   Physical Exam Vitals and nursing note reviewed.  Constitutional:      Comments: Patient intubated, not moving, no spontaneous movements  Cardiovascular:     Rate and Rhythm: Bradycardia present.  Pulmonary:     Effort: Pulmonary effort is normal.  Abdominal:     General: Abdomen is flat.     Palpations: Abdomen is soft.  Musculoskeletal:        General: No deformity.  Neurological:     Comments: GCS3T     (all labs ordered are listed, but only abnormal results are displayed) Labs Reviewed  URINALYSIS, ROUTINE W REFLEX MICROSCOPIC - Abnormal; Notable for the following components:      Result Value    Glucose, UA 150 (*)    All other components within normal limits  RAPID URINE DRUG SCREEN, HOSP PERFORMED - Abnormal; Notable for the following components:   Cocaine POSITIVE (*)    Tetrahydrocannabinol POSITIVE (*)    All other components within normal limits  CBC WITH DIFFERENTIAL/PLATELET - Abnormal; Notable for the following components:   WBC 14.6 (*)    MCV 102.2 (*)    MCHC 29.4 (*)    nRBC 0.4 (*)    All other components within normal limits  I-STAT CHEM 8, ED - Abnormal; Notable for the following components:   Potassium 5.5 (*)    BUN 24 (*)    Creatinine, Ser 2.00 (*)    Glucose, Bld 39 (*)    Calcium , Ion 1.03 (*)    TCO2 19 (*)    All other components within normal limits  I-STAT VENOUS BLOOD GAS, ED - Abnormal; Notable for the following components:   pH, Ven 6.883 (*)    pCO2, Ven 89.2 (*)    pO2, Ven 123 (*)    Bicarbonate 16.8 (*)    TCO2 19 (*)    Acid-base deficit 18.0 (*)    Potassium 5.4 (*)    Calcium , Ion 1.02 (*)    All other  components within normal limits  I-STAT ARTERIAL BLOOD GAS, ED - Abnormal; Notable for the following components:   pH, Arterial 7.059 (*)    pCO2 arterial 53.1 (*)    pO2, Arterial 460 (*)    Bicarbonate 16.6 (*)    TCO2 19 (*)    Acid-base deficit 16.0 (*)    Potassium 6.0 (*)    Calcium , Ion 1.09 (*)    All other components within normal limits  I-STAT VENOUS BLOOD GAS, ED - Abnormal; Notable for the following components:   pCO2, Ven 67.0 (*)    pO2, Ven 57 (*)    Bicarbonate 29.9 (*)    Sodium 147 (*)    Potassium 5.6 (*)    Calcium , Ion >2.50 (*)    HCT 35.0 (*)    Hemoglobin 11.9 (*)    All other components within normal limits  I-STAT CHEM 8, ED - Abnormal; Notable for the following components:   Sodium 147 (*)    Potassium 5.6 (*)    Chloride 115 (*)    BUN 24 (*)    Creatinine, Ser 2.30 (*)    Glucose, Bld 38 (*)    Calcium , Ion >2.50 (*)    Hemoglobin 11.9 (*)    HCT 35.0 (*)    All other components  within normal limits  MRSA NEXT GEN BY PCR, NASAL  COMPREHENSIVE METABOLIC PANEL WITH GFR  ETHANOL  HIV ANTIBODY (ROUTINE TESTING W REFLEX)  ACETAMINOPHEN  LEVEL  SALICYLATE LEVEL  TYPE AND SCREEN  TROPONIN I (HIGH SENSITIVITY)    EKG: EKG Interpretation Date/Time:  Friday July 13 2023 13:24:30 EDT Ventricular Rate:  59 PR Interval:  170 QRS Duration:  152 QT Interval:  519 QTC Calculation: 515 R Axis:   66  Text Interpretation: Sinus rhythm Consider left atrial enlargement Left bundle branch block Confirmed by Mannie Pac [phone]) on 07/13/2023 2:18:27 PM  Radiology: [facility]COLA Abd 1 View Result Date: 07/13/2023 CLINICAL DATA:  Orogastric tube placement.  Post CPR. EXAM: ABDOMEN - 1 VIEW COMPARISON:  Same day chest radiographs. FINDINGS: 1345 hours. Single supine view of the upper abdomen. Tip of the orogastric tube projects over the left subphrenic area, likely in the gastric fundus. The side hole is near the GE junction. Overlying external pacer leads are present. The visualized bowel gas pattern is unremarkable. IMPRESSION: Orogastric tube tip projects over the gastric fundus with the side hole near the GE junction. Electronically Signed   By: Elsie Perone M.D.   On: 07/13/2023 13:57   DG Chest Port 1 View Result Date: 07/13/2023 CLINICAL DATA:  Trauma patient. Endotracheal tube in place post CPR. EXAM: PORTABLE CHEST 1 VIEW COMPARISON:  Radiographs 05/03/2004. FINDINGS: 1345 hours. Interval somatic growth. Tip of the endotracheal tube is in the mid trachea. Enteric tube projects below the diaphragm, tip not visualized. The heart size and mediastinal contours are normal. The lungs appear clear. No pleural effusion or pneumothorax. No evidence of acute fracture. IMPRESSION: Satisfactory position of support system. No evidence of acute chest injury. Electronically Signed   By: Elsie Perone M.D.   On: 07/13/2023 13:56     .Critical Care  Performed by: Mannie Pac DASEN, DO Authorized  by: Mannie Pac DASEN, DO   Critical care provider statement:    Critical care time (minutes):  77   Critical care was necessary to treat or prevent imminent or life-threatening deterioration of the following conditions:  Circulatory failure, cardiac failure and shock   Critical care was  time spent personally by me on the following activities:  Development of treatment plan with patient or surrogate, discussions with consultants, evaluation of patient's response to treatment, examination of patient, ordering and review of laboratory studies, ordering and review of radiographic studies, ordering and performing treatments and interventions, pulse oximetry, re-evaluation of patient's condition and review of old charts CPR  Date/Time: 07/13/2023 2:24 PM  Performed by: Mannie Fairy DASEN, DO Authorized by: Mannie Fairy DASEN, DO  CPR Procedure Details:    ACLS/BLS initiated by EMS: Yes     CPR/ACLS performed in the ED: Yes     Duration of CPR (minutes):  10   Outcome: ROSC obtained    CPR performed via ACLS guidelines under my direct supervision.  See RN documentation for details including defibrillator use, medications, doses and timing. ARTERIAL LINE  Date/Time: 07/13/2023 2:24 PM  Performed by: Mannie Fairy DASEN, DO Authorized by: Mannie Fairy DASEN, DO   Consent:    Consent obtained:  Emergent situation Universal protocol:    Patient identity confirmed:  Arm band Indications:    Indications: hemodynamic monitoring and multiple ABGs   Pre-procedure details:    Skin preparation:  Chlorhexidine  Sedation:    Sedation type:  None Anesthesia:    Anesthesia method:  None Procedure details:    Location:  R radial   Needle gauge:  20 G   Placement technique:  Seldinger   Number of attempts:  1   Transducer: waveform confirmed   Post-procedure details:    Post-procedure:  Biopatch applied, secured with tape, sterile dressing applied and wrist guard applied   CMS:  Normal   Procedure  completion:  Tolerated Central Line  Date/Time: 07/13/2023 2:25 PM  Performed by: Mannie Fairy DASEN, DO Authorized by: Mannie Fairy DASEN, DO   Consent:    Consent obtained:  Emergent situation Universal protocol:    Patient identity confirmed:  Arm band Pre-procedure details:    Indication(s): central venous access and insufficient peripheral access     Sterile barrier technique: All elements of maximal sterile technique followed     Skin preparation:  Chlorhexidine  Sedation:    Sedation type:  None Anesthesia:    Anesthesia method:  None Procedure details:    Location:  R femoral   Patient position:  Supine   Procedural supplies:  Triple lumen   Catheter size:  7 Fr   Landmarks identified: yes     Ultrasound guidance: yes     Ultrasound guidance timing: real time     Sterile ultrasound techniques: Sterile gel and sterile probe covers were used     Number of attempts:  1   Successful placement: yes   Post-procedure details:    Post-procedure:  Dressing applied and line sutured   Assessment:  Blood return through all ports and free fluid flow   Procedure completion:  Tolerated Comments:     Confirmed wire in femoral vein prior to dilation.    Medications Ordered in the ED  EPINEPHrine  (ADRENALIN ) 5 mg in NS 250 mL (0.02 mg/mL) premix infusion (20 mcg/min Intravenous Rate/Dose Change 07/13/23 1248)  vasopressin  (PITRESSIN) 20 Units in 100 mL (0.2 unit/mL) infusion-*FOR SHOCK* (0.04 Units/min Intravenous Rate/Dose Change 07/13/23 1256)  norepinephrine  (LEVOPHED ) 4mg  in (0.016 mg/mL) premix infusion (10 mcg/min Intravenous Rate/Dose Change 07/13/23 1353)  piperacillin -tazobactam (ZOSYN ) IVPB 3.375 g (has no administration in time range)  vancomycin  (VANCOREADY) IVPB 1750 mg/350 mL (has no administration in time range)  docusate sodium  (COLACE) capsule 100 mg (  has no administration in time range)  polyethylene glycol (MIRALAX  / GLYCOLAX ) packet 17 g (has no administration in  time range)  famotidine  (PEPCID ) tablet 20 mg (has no administration in time range)  docusate (COLACE) 50 MG/5ML liquid 100 mg (has no administration in time range)  polyethylene glycol (MIRALAX  / GLYCOLAX ) packet 17 g (has no administration in time range)  ondansetron  (ZOFRAN ) injection 4 mg (has no administration in time range)  acetaminophen  (TYLENOL ) tablet 650 mg (has no administration in time range)    Or  acetaminophen  (TYLENOL ) 160 MG/5ML solution 650 mg (has no administration in time range)    Or  acetaminophen  (TYLENOL ) suppository 650 mg (has no administration in time range)  acetaminophen  (TYLENOL ) tablet 650 mg (has no administration in time range)    Or  acetaminophen  (TYLENOL ) 160 MG/5ML solution 650 mg (has no administration in time range)    Or  acetaminophen  (TYLENOL ) suppository 650 mg (has no administration in time range)  busPIRone  (BUSPAR ) tablet 30 mg (has no administration in time range)    Or  busPIRone  (BUSPAR ) tablet 30 mg (has no administration in time range)  magnesium  sulfate IVPB 2 g 50 mL (has no administration in time range)  0.9 %  sodium chloride  infusion (has no administration in time range)  enoxaparin  (LOVENOX ) injection 40 mg (has no administration in time range)  Chlorhexidine  Gluconate Cloth 2 % PADS 6 each (has no administration in time range)  fentaNYL  (SUBLIMAZE ) injection 50 mcg (has no administration in time range)  fentaNYL  (SUBLIMAZE ) injection 50-200 mcg (has no administration in time range)  sodium bicarbonate  150 mEq in dextrose  5 % 1,150 mL infusion (has no administration in time range)  dextrose  50 % solution 50 mL (has no administration in time range)  lactated ringers  bolus 1,000 mL (1,000 mLs Intravenous New Bag/Given 07/13/23 1339)  lactated ringers  bolus 1,000 mL (1,000 mLs Intravenous New Bag/Given 07/13/23 1421)  sodium bicarbonate  injection 100 mEq (100 mEq Intravenous Given 07/13/23 1346)  EPINEPHrine  (ADRENALIN ) 1 MG/10ML injection  (1 mg Intravenous Given 07/13/23 1354)  sodium bicarbonate  injection (50 mEq Intravenous Given 07/13/23 1355)  calcium  chloride injection (1 g Intravenous Given 07/13/23 1358)                                    Medical Decision Making 23 year old male here today postarrest ROSC.  Plan-upon arrival to the emergency room, patient had soft blood pressure, however had organized rhythm on monitor, organized cardiac activity on bedside ultrasound.  Femoral pulses appreciated on ultrasound.  Started the patient on epinephrine  drip, added and vasopressin  when maps were still soft, added an epinephrine  with maps greater than 65 achieved.  Art line, central line placed.  ABG in the patient showing a potassium of 6, pH of 7.0.  Surprisingly better ABG than anticipated.  Ordered IV fluids on the patient.  Out of abundance of caution, ordered antibiotics for sepsis although believe this is less likely.  Patient found to be hypothermic.  This likely does offer some protective effects against anoxic brain injury.  I have advised for cautious rewarming with a goal temp of 95 Fahrenheit.  Primary concern for this patient is going to be an anoxic brain injury given unknown downtime.  Patient's hypoglycemia not reported to me.  The only blood work which I was available to review was his ABG which was handed to me on paper.  While  patient was hypoglycemic, I was inserting lines to monitor patient's blood pressure, provide vasopressors, also discussing patient with family.  Hypoglycemia later corrected.   Reassessment 2 PM-patient had period of PEA.  Chest compressions were began near instantaneously as there were multiple ED staff in the room.  IR arrived shortly thereafter, advised on giving epinephrine , ICU team was there and recommended additional bicarbonate.  Shortly before patient coded, they provided some bicarbonate.  Gave dose of calcium  as well.  Patient had ROSC following 1 round of CPR.  Patient will  obtain CT imaging of the head, neck, chest abdomen pelvis on his way to ICU.  Patient cannot currently on any sedation, has not shown any meaningful neurologic activity.  Care discussed with family.  Amount and/or Complexity of Data Reviewed Labs: ordered. Radiology: ordered.  Risk Prescription drug management. Decision regarding hospitalization.        Final diagnoses:  Cardiac arrest [facility])    ED Discharge Orders     None          Mannie Fairy DASEN, DO 07/13/23 1426    Mannie Fairy T, DO 07/13/23 1535    Mannie Fairy T, DO 07/13/23 1546    Mannie Fairy T, DO 07/13/23 1554

## 2023-07-13 NOTE — Plan of Care (Signed)
  Problem: Education: Goal: Ability to manage disease process will improve Outcome: Progressing   Problem: Cardiac: Goal: Ability to achieve and maintain adequate cardiopulmonary perfusion will improve Outcome: Progressing   Problem: Neurologic: Goal: Promote progressive neurologic recovery Outcome: Progressing   Problem: Skin Integrity: Goal: Risk for impaired skin integrity will be minimized. Outcome: Progressing   Problem: Education: Goal: Knowledge of General Education information will improve Description: Including pain rating scale, medication(s)/side effects and non-pharmacologic comfort measures Outcome: Progressing   Problem: Health Behavior/Discharge Planning: Goal: Ability to manage health-related needs will improve Outcome: Progressing   Problem: Clinical Measurements: Goal: Ability to maintain clinical measurements within normal limits will improve Outcome: Progressing Goal: Will remain free from infection Outcome: Progressing Goal: Diagnostic test results will improve Outcome: Progressing Goal: Respiratory complications will improve Outcome: Progressing Goal: Cardiovascular complication will be avoided Outcome: Progressing   Problem: Activity: Goal: Risk for activity intolerance will decrease Outcome: Progressing   Problem: Nutrition: Goal: Adequate nutrition will be maintained Outcome: Progressing   Problem: Coping: Goal: Level of anxiety will decrease Outcome: Progressing   Problem: Elimination: Goal: Will not experience complications related to bowel motility Outcome: Progressing Goal: Will not experience complications related to urinary retention Outcome: Progressing   Problem: Pain Managment: Goal: General experience of comfort will improve and/or be controlled Outcome: Progressing   Problem: Safety: Goal: Ability to remain free from injury will improve Outcome: Progressing   Problem: Skin Integrity: Goal: Risk for impaired skin  integrity will decrease Outcome: Progressing   Problem: Education: Goal: Ability to describe self-care measures that may prevent or decrease complications (Diabetes Survival Skills Education) will improve Outcome: Progressing Goal: Individualized Educational Video(s) Outcome: Progressing   Problem: Coping: Goal: Ability to adjust to condition or change in health will improve Outcome: Progressing   Problem: Fluid Volume: Goal: Ability to maintain a balanced intake and output will improve Outcome: Progressing   Problem: Health Behavior/Discharge Planning: Goal: Ability to identify and utilize available resources and services will improve Outcome: Progressing Goal: Ability to manage health-related needs will improve Outcome: Progressing   Problem: Metabolic: Goal: Ability to maintain appropriate glucose levels will improve Outcome: Progressing   Problem: Nutritional: Goal: Maintenance of adequate nutrition will improve Outcome: Progressing Goal: Progress toward achieving an optimal weight will improve Outcome: Progressing   Problem: Skin Integrity: Goal: Risk for impaired skin integrity will decrease Outcome: Progressing   Problem: Tissue Perfusion: Goal: Adequacy of tissue perfusion will improve Outcome: Progressing   Problem: Activity: Goal: Ability to tolerate increased activity will improve Outcome: Progressing   Problem: Respiratory: Goal: Ability to maintain a clear airway and adequate ventilation will improve Outcome: Progressing   Problem: Role Relationship: Goal: Method of communication will improve Outcome: Progressing

## 2023-07-13 NOTE — ED Notes (Signed)
 ICU providers at beside verbal order to keep bair hugger on low setting to this RN.

## 2023-07-13 NOTE — ED Notes (Signed)
 EDP Mannie placing central line to Rt groin at this time.

## 2023-07-13 NOTE — Progress Notes (Signed)
 RTx3 proned pt without any complications. Pt ETT secured with cloth taped @ 24 at lip. Pt head to the right and right arm in swimmers position. RT able to pass suction catheter.

## 2023-07-13 NOTE — ED Notes (Signed)
 Rolan RN, Tori RN, Prentice NT & Chelsea RT transporting pt to CT on the way to [facility] ICU.

## 2023-07-13 NOTE — Progress Notes (Signed)
 ABG results not transferring to chart. Charge nurse notified. Results given to CCM.  pH: 7.24  CO2: 61.2 PO2: 59  HCO3: 24.6 Sats: 68%

## 2023-07-13 NOTE — ED Notes (Signed)
 Family updated in consultation room & is at bedside.

## 2023-07-13 NOTE — Progress Notes (Signed)
 Pharmacy Antibiotic Note  Steven Hood is a 23 y.o. male admitted post CPR and another brief CPR in ED on 07/13/2023 with pneumonia.  Pharmacy has been consulted for Zosyn  dosing.   Zosyn  3.375g IV x1 ordered in ED, not given and patient now on floor - asked RN to give now.  SCr elevated at 2.30. CrCl ~ 52 mL/min.  Currently in shock on epinephrine , norepinephrine , and vasopressin .  WBC is elevated, left shift noted, CXR with B/L infiltrates, hypothermic  Plan: Zosyn  3.375g IV q8h (4 hour infusion).   Height: 5' 10 (177.8 cm) IBW/kg (Calculated) : 73  Temp (24hrs), Avg:87.4 F (30.8 C), Min:86.9 F (30.5 C), Max:87.8 F (31 C)  Recent Labs  Lab 07/13/23 1254 07/13/23 1305 07/13/23 1406  WBC 14.6*  --   --   CREATININE 2.25* 2.00* 2.30*    CrCl cannot be calculated (Unknown ideal weight.).    No Known Allergies  Antimicrobials this admission: Zosyn  7/4 >>  Dose adjustments this admission:   Microbiology results: 7/4 BCx:  7/4 Sputum:   7/4 MRSA PCR:   Thank you for allowing pharmacy to be a part of this patient's care.  Anselma Herbel Brown 07/13/2023 3:25 PM

## 2023-07-13 NOTE — Progress Notes (Signed)
 VAST consult received to remove IO. Upon arrival at patient's bedside, ICU RN verbalized that patient continues to need IO access. Access assessed and left in place.

## 2023-07-13 NOTE — Progress Notes (Signed)
 EEG complete - results pending

## 2023-07-13 NOTE — Progress Notes (Addendum)
 Patient has a room available in [facility] - will complete EEG when he arrives there.

## 2023-07-13 NOTE — Code Documentation (Signed)
 This RN attempted to call [facility] to give report, was informed they are attempting to get pt to [facility].

## 2023-07-13 NOTE — Progress Notes (Addendum)
 Pt arrived to floor and having tube placed. And Xray in room. Will try back as schedule permits

## 2023-07-13 NOTE — Code Documentation (Addendum)
 Pulse check at 1356, ROSC obtained.

## 2023-07-13 NOTE — ED Notes (Signed)
 X-ray at bedside

## 2023-07-13 NOTE — Progress Notes (Signed)
 I was called at bedside due to hypoxemia, patient's oxygen saturation was in 60s, he was started on Veletri , initially oxygen saturation improved to 88% but then again it trended down to 79, he was given 80 mg of Lasix , his vasopressor requirement was going up and lactate was over 9.  Vasopressors were titrated up to 40 of levo, vasopressin  at 0.04 and epinephrine  at 10, patient started making urine As with the likely patient's oxygen saturation was not improving, decision was made to prone the patient, immediately after proning the patient his oxygen saturation improved to 100%  Will repeat ABG in an hour Continue broad-spectrum antibiotics with vancomycin  and Zosyn    Additional critical care time spent 65 minutes   Valinda Novas, MD

## 2023-07-13 NOTE — ED Notes (Signed)
 Bair hugger placed on pt with Temp goal of 95 via core temp per EDP Stevens.

## 2023-07-13 NOTE — ED Notes (Signed)
 EDP at bedside preparing to insert A-Line & Central line.

## 2023-07-13 NOTE — ED Notes (Signed)
 A-line inserted to Rt wrist.

## 2023-07-13 NOTE — Progress Notes (Addendum)
 eLink Physician-Brief Progress Note Patient Name: Steven Hood DOB: April 27, 2000 MRN: [id number]   Date of Service  07/13/2023  HPI/Events of Note  23 year old male with insignificant past medical history who presented after he was found unresponsive by family, noted to be in PEA cardiac arrest, ROSC was achieved after 12 minutes.   Critical troponin of 2077, uptrending.  Lactic acid downtrending at 6.8.   eICU Interventions  Trop with AM labs, trend lactic also Add SSI and Glargine 5u   0440 -has evidence of shock liver but now febrile.  Avoid acetaminophen , initiate external cooling protocol with TTM goal normothermia.  Trend LFTs, initiate NAC if LFTs still elevated  Intervention Category Minor Interventions: Routine modifications to care plan (e.g. PRN medications for pain, fever)  Ricardo Schubach 07/13/2023, 11:21 PM

## 2023-07-13 NOTE — ED Notes (Signed)
 ICU providers at bedside

## 2023-07-14 ENCOUNTER — Encounter (HOSPITAL_COMMUNITY): Payer: Self-pay | Admitting: Internal Medicine

## 2023-07-14 ENCOUNTER — Inpatient Hospital Stay (HOSPITAL_COMMUNITY)

## 2023-07-14 DIAGNOSIS — R7989 Other specified abnormal findings of blood chemistry: Secondary | ICD-10-CM

## 2023-07-14 DIAGNOSIS — J80 Acute respiratory distress syndrome: Secondary | ICD-10-CM

## 2023-07-14 DIAGNOSIS — I469 Cardiac arrest, cause unspecified: Secondary | ICD-10-CM

## 2023-07-14 DIAGNOSIS — N179 Acute kidney failure, unspecified: Secondary | ICD-10-CM

## 2023-07-14 DIAGNOSIS — R569 Unspecified convulsions: Secondary | ICD-10-CM

## 2023-07-14 DIAGNOSIS — G931 Anoxic brain damage, not elsewhere classified: Secondary | ICD-10-CM | POA: Diagnosis not present

## 2023-07-14 DIAGNOSIS — K7200 Acute and subacute hepatic failure without coma: Secondary | ICD-10-CM

## 2023-07-14 DIAGNOSIS — E871 Hypo-osmolality and hyponatremia: Secondary | ICD-10-CM

## 2023-07-14 LAB — BLOOD GAS, ARTERIAL
Acid-Base Excess: 5.7 mmol/L — ABNORMAL HIGH (ref 0.0–2.0)
Acid-Base Excess: 7.7 mmol/L — ABNORMAL HIGH (ref 0.0–2.0)
Bicarbonate: 32.1 mmol/L — ABNORMAL HIGH (ref 20.0–28.0)
Bicarbonate: 31 mmol/L — ABNORMAL HIGH (ref 20.0–28.0)
Drawn by: NEGATIVE
O2 Saturation: 99.5 %
O2 Saturation: 100 %
Patient temperature: 37
Patient temperature: 36.7
pCO2 arterial: 53 mmHg — ABNORMAL HIGH (ref 32–48)
pCO2 arterial: 38 mmHg (ref 32–48)
pH, Arterial: 7.39 (ref 7.35–7.45)
pH, Arterial: 7.52 — ABNORMAL HIGH (ref 7.35–7.45)
pO2, Arterial: 116 mmHg — ABNORMAL HIGH (ref 83–108)
pO2, Arterial: 193 mmHg — ABNORMAL HIGH (ref 83–108)

## 2023-07-14 LAB — POCT I-STAT 7, (LYTES, BLD GAS, ICA,H+H)
Acid-Base Excess: 4 mmol/L — ABNORMAL HIGH (ref 0.0–2.0)
Acid-Base Excess: 6 mmol/L — ABNORMAL HIGH (ref 0.0–2.0)
Acid-base deficit: 9 mmol/L — ABNORMAL HIGH (ref 0.0–2.0)
Bicarbonate: 20.9 mmol/L (ref 20.0–28.0)
Bicarbonate: 30.5 mmol/L — ABNORMAL HIGH (ref 20.0–28.0)
Bicarbonate: 32.6 mmol/L — ABNORMAL HIGH (ref 20.0–28.0)
Calcium, Ion: 1.19 mmol/L (ref 1.15–1.40)
Calcium, Ion: 0.97 mmol/L — ABNORMAL LOW (ref 1.15–1.40)
Calcium, Ion: 0.91 mmol/L — ABNORMAL LOW (ref 1.15–1.40)
HCT: 52 % (ref 39.0–52.0)
HCT: 45 % (ref 39.0–52.0)
HCT: 43 % (ref 39.0–52.0)
Hemoglobin: 17.7 g/dL — ABNORMAL HIGH (ref 13.0–17.0)
Hemoglobin: 15.3 g/dL (ref 13.0–17.0)
Hemoglobin: 14.6 g/dL (ref 13.0–17.0)
O2 Saturation: 74 %
O2 Saturation: 100 %
O2 Saturation: 100 %
Patient temperature: 31.4
Patient temperature: 98.4
Patient temperature: 36.9
Potassium: 3.9 mmol/L (ref 3.5–5.1)
Potassium: 2.6 mmol/L — CL (ref 3.5–5.1)
Potassium: 3 mmol/L — ABNORMAL LOW (ref 3.5–5.1)
Sodium: 142 mmol/L (ref 135–145)
Sodium: 142 mmol/L (ref 135–145)
Sodium: 140 mmol/L (ref 135–145)
TCO2: 23 mmol/L (ref 22–32)
TCO2: 32 mmol/L (ref 22–32)
TCO2: 34 mmol/L — ABNORMAL HIGH (ref 22–32)
pCO2 arterial: 46.1 mmHg (ref 32–48)
pCO2 arterial: 51.7 mmHg — ABNORMAL HIGH (ref 32–48)
pCO2 arterial: 54.7 mmHg — ABNORMAL HIGH (ref 32–48)
pH, Arterial: 7.231 — ABNORMAL LOW (ref 7.35–7.45)
pH, Arterial: 7.378 (ref 7.35–7.45)
pH, Arterial: 7.382 (ref 7.35–7.45)
pO2, Arterial: 34 mmHg — CL (ref 83–108)
pO2, Arterial: 200 mmHg — ABNORMAL HIGH (ref 83–108)
pO2, Arterial: 261 mmHg — ABNORMAL HIGH (ref 83–108)

## 2023-07-14 LAB — CBC
HCT: 46.9 % (ref 39.0–52.0)
Hemoglobin: 15.7 g/dL (ref 13.0–17.0)
MCH: 30 pg (ref 26.0–34.0)
MCHC: 33.5 g/dL (ref 30.0–36.0)
MCV: 89.7 fL (ref 80.0–100.0)
Platelets: 233 K/uL (ref 150–400)
RBC: 5.23 MIL/uL (ref 4.22–5.81)
RDW: 13.2 % (ref 11.5–15.5)
WBC: 3.5 K/uL — ABNORMAL LOW (ref 4.0–10.5)
nRBC: 0.6 % — ABNORMAL HIGH (ref 0.0–0.2)

## 2023-07-14 LAB — COMPREHENSIVE METABOLIC PANEL WITH GFR
ALT: 2189 U/L — ABNORMAL HIGH (ref 0–44)
ALT: 2014 U/L — ABNORMAL HIGH (ref 0–44)
AST: 2160 U/L — ABNORMAL HIGH (ref 15–41)
AST: 1642 U/L — ABNORMAL HIGH (ref 15–41)
Albumin: 2.6 g/dL — ABNORMAL LOW (ref 3.5–5.0)
Albumin: 2.4 g/dL — ABNORMAL LOW (ref 3.5–5.0)
Alkaline Phosphatase: 70 U/L (ref 38–126)
Alkaline Phosphatase: 73 U/L (ref 38–126)
Anion gap: 13 (ref 5–15)
Anion gap: 15 (ref 5–15)
BUN: 28 mg/dL — ABNORMAL HIGH (ref 6–20)
BUN: 25 mg/dL — ABNORMAL HIGH (ref 6–20)
CO2: 29 mmol/L (ref 22–32)
CO2: 28 mmol/L (ref 22–32)
Calcium: 7.4 mg/dL — ABNORMAL LOW (ref 8.9–10.3)
Calcium: 7.1 mg/dL — ABNORMAL LOW (ref 8.9–10.3)
Chloride: 102 mmol/L (ref 98–111)
Chloride: 97 mmol/L — ABNORMAL LOW (ref 98–111)
Creatinine, Ser: 2.29 mg/dL — ABNORMAL HIGH (ref 0.61–1.24)
Creatinine, Ser: 2.25 mg/dL — ABNORMAL HIGH (ref 0.61–1.24)
GFR, Estimated: 40 mL/min — ABNORMAL LOW (ref >60)
GFR, Estimated: 41 mL/min — ABNORMAL LOW (ref >60)
Glucose, Bld: 139 mg/dL — ABNORMAL HIGH (ref 70–99)
Glucose, Bld: 163 mg/dL — ABNORMAL HIGH (ref 70–99)
Potassium: 3.1 mmol/L — ABNORMAL LOW (ref 3.5–5.1)
Potassium: 2.9 mmol/L — ABNORMAL LOW (ref 3.5–5.1)
Sodium: 144 mmol/L (ref 135–145)
Sodium: 140 mmol/L (ref 135–145)
Total Bilirubin: 1.8 mg/dL — ABNORMAL HIGH (ref 0.0–1.2)
Total Bilirubin: 1.8 mg/dL — ABNORMAL HIGH (ref 0.0–1.2)
Total Protein: 5.1 g/dL — ABNORMAL LOW (ref 6.5–8.1)
Total Protein: 5 g/dL — ABNORMAL LOW (ref 6.5–8.1)

## 2023-07-14 LAB — ECHOCARDIOGRAM COMPLETE
AR max vel: 3.01 cm2
AV Area VTI: 2.55 cm2
AV Area mean vel: 2.97 cm2
AV Mean grad: 2 mmHg
AV Peak grad: 3.2 mmHg
Ao pk vel: 0.89 m/s
Area-P 1/2: 3.83 cm2
Calc EF: 42.2 %
Height: 70 in
S' Lateral: 3.3 cm
Single Plane A2C EF: 40.3 %
Single Plane A4C EF: 43 %
Weight: 2843.05 [oz_av]

## 2023-07-14 LAB — POCT I-STAT EG7
Acid-base deficit: 2 mmol/L (ref 0.0–2.0)
Bicarbonate: 24.6 mmol/L (ref 20.0–28.0)
Calcium, Ion: 1.04 mmol/L — ABNORMAL LOW (ref 1.15–1.40)
HCT: 46 % (ref 39.0–52.0)
Hemoglobin: 15.6 g/dL (ref 13.0–17.0)
O2 Saturation: 68 %
Patient temperature: 43
Potassium: 4.7 mmol/L (ref 3.5–5.1)
Sodium: 141 mmol/L (ref 135–145)
TCO2: 26 mmol/L (ref 22–32)
pCO2, Ven: 61.2 mmHg — ABNORMAL HIGH (ref 44–60)
pH, Ven: 7.24 — ABNORMAL LOW (ref 7.25–7.43)
pO2, Ven: 59 mmHg — ABNORMAL HIGH (ref 32–45)

## 2023-07-14 LAB — CK TOTAL AND CKMB (NOT AT ARMC)
CK, MB: 390.1 ng/mL — ABNORMAL HIGH (ref 0.5–5.0)
Total CK: >20000 U/L — ABNORMAL HIGH (ref 49–397)

## 2023-07-14 LAB — GLUCOSE, CAPILLARY
Glucose-Capillary: 209 mg/dL — ABNORMAL HIGH (ref 70–99)
Glucose-Capillary: 136 mg/dL — ABNORMAL HIGH (ref 70–99)
Glucose-Capillary: 157 mg/dL — ABNORMAL HIGH (ref 70–99)
Glucose-Capillary: 137 mg/dL — ABNORMAL HIGH (ref 70–99)
Glucose-Capillary: 65 mg/dL — ABNORMAL LOW (ref 70–99)
Glucose-Capillary: 122 mg/dL — ABNORMAL HIGH (ref 70–99)

## 2023-07-14 LAB — PROTIME-INR
INR: 1.2 (ref 0.8–1.2)
Prothrombin Time: 15.4 s — ABNORMAL HIGH (ref 11.4–15.2)

## 2023-07-14 LAB — OSMOLALITY, URINE: Osmolality, Ur: 442 mosm/kg (ref 300–900)

## 2023-07-14 LAB — MAGNESIUM: Magnesium: 1.3 mg/dL — ABNORMAL LOW (ref 1.7–2.4)

## 2023-07-14 LAB — PHOSPHORUS: Phosphorus: <1 mg/dL — CL (ref 2.5–4.6)

## 2023-07-14 LAB — TROPONIN I (HIGH SENSITIVITY): Troponin I (High Sensitivity): 13648 ng/L (ref <18)

## 2023-07-14 LAB — CK: Total CK: >20000 U/L — ABNORMAL HIGH (ref 49–397)

## 2023-07-14 LAB — LACTIC ACID, PLASMA: Lactic Acid, Venous: 5.3 mmol/L (ref 0.5–1.9)

## 2023-07-14 LAB — SODIUM, URINE, RANDOM: Sodium, Ur: 68 mmol/L

## 2023-07-14 MED ORDER — K PHOS MONO-SOD PHOS DI & MONO 155-852-130 MG PO TABS
500.0000 mg | ORAL_TABLET | Freq: Once | ORAL | Status: AC
  Administered 2023-07-14: 500 mg
  Filled 2023-07-14: qty 2

## 2023-07-14 MED ORDER — LACTATED RINGERS IV SOLN: INTRAVENOUS | Status: AC

## 2023-07-14 MED ORDER — POTASSIUM CHLORIDE 10 MEQ/50ML IV SOLN
10.0000 meq | INTRAVENOUS | Status: AC
  Administered 2023-07-14 (×4): 10 meq via INTRAVENOUS
  Filled 2023-07-14 (×4): qty 50

## 2023-07-14 MED ORDER — DEXTROSE 50 % IV SOLN
INTRAVENOUS | Status: AC
  Administered 2023-07-14: 25 mL
  Filled 2023-07-14: qty 50

## 2023-07-14 MED ORDER — POTASSIUM PHOSPHATES 15 MMOLE/5ML IV SOLN
30.0000 mmol | Freq: Once | INTRAVENOUS | Status: AC
  Administered 2023-07-14: 30 mmol via INTRAVENOUS
  Filled 2023-07-14: qty 10

## 2023-07-14 MED ORDER — VANCOMYCIN HCL 1500 MG/300ML IV SOLN
1500.0000 mg | INTRAVENOUS | Status: DC
  Administered 2023-07-14: 1500 mg via INTRAVENOUS
  Filled 2023-07-14 (×2): qty 300

## 2023-07-14 MED ORDER — CALCIUM GLUCONATE-NACL 1-0.675 GM/50ML-% IV SOLN
1.0000 g | Freq: Once | INTRAVENOUS | Status: AC
  Administered 2023-07-14: 1000 mg via INTRAVENOUS
  Filled 2023-07-14: qty 50

## 2023-07-14 MED ORDER — LEVETIRACETAM (KEPPRA) 500 MG/5 ML ADULT IV PUSH
4500.0000 mg | INTRAVENOUS | Status: AC
  Administered 2023-07-14: 4500 mg via INTRAVENOUS
  Filled 2023-07-14: qty 45

## 2023-07-14 MED ORDER — SODIUM CHLORIDE 3 % IN NEBU: 4.0000 mL | INHALATION_SOLUTION | Freq: Two times a day (BID) | RESPIRATORY_TRACT | Status: DC

## 2023-07-14 MED ORDER — VANCOMYCIN HCL 1500 MG/300ML IV SOLN
1500.0000 mg | INTRAVENOUS | Status: DC
  Filled 2023-07-14: qty 300

## 2023-07-14 MED ORDER — EPINEPHRINE 1 MG/10ML IJ SOSY
PREFILLED_SYRINGE | INTRAMUSCULAR | Status: AC
  Filled 2023-07-14: qty 10

## 2023-07-14 MED ORDER — EPINEPHRINE 1 MG/10ML IJ SOSY
1.0000 mg | PREFILLED_SYRINGE | Freq: Once | INTRAMUSCULAR | Status: AC
  Administered 2023-07-14: 1 mg via INTRAVENOUS

## 2023-07-14 MED ORDER — LORAZEPAM 2 MG/ML IJ SOLN
2.0000 mg | INTRAMUSCULAR | Status: AC
  Administered 2023-07-14: 2 mg via INTRAVENOUS
  Filled 2023-07-14: qty 1

## 2023-07-14 MED ORDER — MAGNESIUM SULFATE 2 GM/50ML IV SOLN
2.0000 g | Freq: Once | INTRAVENOUS | Status: AC
  Administered 2023-07-14: 2 g via INTRAVENOUS
  Filled 2023-07-14: qty 50

## 2023-07-14 MED ORDER — POTASSIUM CHLORIDE 20 MEQ PO PACK
60.0000 meq | PACK | Freq: Once | ORAL | Status: AC
  Administered 2023-07-14: 60 meq
  Filled 2023-07-14: qty 3

## 2023-07-14 NOTE — Progress Notes (Signed)
 CBG 65 at 2204. Rechecked after giving D50  amp at 2237 and now 122. MD in box notified via [facility] nurse.

## 2023-07-14 NOTE — Progress Notes (Signed)
 NAME:  Steven Hood, MRN:  [id number], DOB:  [birth year], LOS: 1 ADMISSION DATE:  07/13/2023, CONSULTATION DATE: 07/13/2023 REFERRING MD: Dr. Mannie - EDP, CHIEF COMPLAINT: Cardiac arrest  History of Present Illness:  Steven Hood is a 23 year old male with no reported past medical history who presented to the ED via EMS after being found unresponsive by family, concern for substance abuse/unintentional overdose given drug paraphernalia found per EMS.  On EMS arrival patient was found asystolic prompting immediate ACLS patient required 12 minutes of CLS protocol prior to ROSC did lose pulses again and around requiring 1 more round of ACLS before acquiring ROSC again.  On ED arrival patient was seen hypothermic with temperature 86.9 F and hypotensive.  Jorde of blood work pending at time of arrival but i-STAT chemistry reveals potassium 6.0 with creatinine 2.0.  Arterial blood gas with pH 7.0, pCO2 53, PO2 460, bicarb 16.6.  PCCM consulted for further management and admission.  Pertinent  Medical History  None  Significant Hospital Events: Including procedures, antibiotic start and stop dates in addition to other pertinent events   7/4 found unresponsive in asystole with high suspicion for drug overdose required 12 minutes of ACLS 7/5 evolving hypoxic respiratory failure in the setting of ARDS from aspiration pneumonia now proned   Interim History / Subjective:  Sedated on ventilator  Objective    Blood pressure 126/81, pulse (!) 125, temperature 99 F (37.2 C), resp. rate (!) 23, height 5' 10 (1.778 m), weight 80.6 kg, SpO2 90%.    Vent Mode: PRVC FiO2 (%):  [60 %-100 %] 100 % Set Rate:  [20 bmp-30 bmp] 30 bmp Vt Set:  [440 mL-580 mL] 440 mL PEEP:  [5 cmH20-12 cmH20] 12 cmH20 Plateau Pressure:  [23 cmH20-31 cmH20] 27 cmH20   Intake/Output Summary (Last 24 hours) at 07/14/2023 0858 Last data filed at 07/14/2023 0800 Gross per 24 hour  Intake 5934.37 ml  Output 5985 ml  Net  -50.63 ml   Filed Weights   07/13/23 1600 07/14/23 0500  Weight: 80.6 kg 80.6 kg    Examination: General: Acute critically ill appearing adult male lying in bed proned on mechanical ventilation HEENT: ETT, MM pink/moist, PERRL,  Neuro: Sedated on vent CV: s1s2 regular rate and rhythm, no murmur, rubs, or gallops,  PULM: Bilateral rhonchi, tolerating ventilator, prolonged GI: soft, bowel sounds active in all 4 quadrants, non-tender, non-distended, tolerating TF Extremities: warm/dry, no edema  Skin: no rashes or lesions  Resolved problem list   Assessment and Plan  Out-of-hospital cardiac arrest - Original rhythm was asystole per EMS.  Suffered third arrest on my evaluation in ED.  He became progressively bradycardic with loss of pleth of A-line and on pulse check patient was pulseless round of ACLS Circulatory shock - Currently requiring 3 vasopressor supports Elevated troponin -Likely reactive from multiple cardiac arrest P: Goal of normothermia, developed a fever overnight now on cooling pads Continuous telemetry Continue vasopressor support for MAP goal greater than 65 Continue bicarb drip Follow-up echocardiogram  Acute hypoxic and hypercapnic respiratory failure secondary severe aspiration with development of ARDS  -P/F ration 116 meeting criteria for moderate ARDS  P: Continue prone protocol until P?F ratio improved Continue ventilator support with lung protective strategies  Continue Epo  Wean PEEP and FiO2 for sats greater than 90%. Head of bed elevated 30 degrees. Plateau pressures less than 30 cm H20.  Follow intermittent chest x-ray and ABG.   SAT/SBT as tolerated, mentation preclude extubation  Ensure adequate pulmonary hygiene  Follow cultures  VAP bundle in place  PAD protocol  At risk for anoxic encephalopathy. -Estimated 12 minutes of downtime -EEG and head CT negative  P: Maintain neuro protective measures Nutrition and bowel regiment  Seizure  precautions  Aspirations precautions  MRI pending   Shock liver  - LFTs drastically elevated on the a.m. labs indicating likely shock liver during cardiac arrest P: Avoid hepatotoxins Continue supportive care  Acute kidney injury - Creatinine bumped to 2.29 with GFR 40 a.m. 7/5 P: Follow renal function  Monitor urine output Trend Bmet Avoid nephrotoxins Ensure adequate renal perfusion  IV hydration  Substance abuse - Girlfriend reports that they were drinking alcohol night prior to admission and the patient used to use Percocets but she is unsure if he has used recently.  She did find some unknown pills behind bed morning of admission P: CIWA protocol  Monitor for signs for signs of infection  UDS   Concern for acute kidney injury - Full chemistry still pending at time of assessment but i-STAT indicates hypokalemia P: Follow renal function  Monitor urine output Trend Bmet Avoid nephrotoxins Ensure adequate renal perfusion  IV hydration  Hyponatremia Severe hypophosphatemia Hypomagnesemia P: Supplement potassium and phosphate  Best Practice (right click and Reselect all SmartList Selections daily)   Diet/type: NPO DVT prophylaxis SCD Pressure ulcer(s): N/A GI prophylaxis: PPI Lines: Central line and Arterial Line Foley:  Yes, and it is still needed Code Status:  full code Last date of multidisciplinary goals of care discussion: Confirmed with patient's mother he is a full code with all aggressive interventions available  Critical care time:   CRITICAL CARE Performed by: Shriley Joffe D. Harris   Total critical care time: 42 minutes  Critical care time was exclusive of separately billable procedures and treating other patients.  Critical care was necessary to treat or prevent imminent or life-threatening deterioration.  Critical care was time spent personally by me on the following activities: development of treatment plan with patient and/or surrogate as  well as nursing, discussions with consultants, evaluation of patient's response to treatment, examination of patient, obtaining history from patient or surrogate, ordering and performing treatments and interventions, ordering and review of laboratory studies, ordering and review of radiographic studies, pulse oximetry and re-evaluation of patient's condition.  Sanad Fearnow D. Harris, NP-C [facility] Personal contact information can be found on Amion  If no contact or response made please call [phone] 07/14/2023, 8:58 AM

## 2023-07-14 NOTE — Procedures (Signed)
 Patient Name: Steven Hood  MRN: [id number]  Epilepsy Attending: Arlin MALVA Krebs  Referring Physician/Provider: Harold Scholz, MD  Date: 07/13/2023 Duration: 22.10 mins  Patient history: 23 yo M s/p cardaic arrest. EEG to evaluate for seizure  Level of alertness: comatose  AEDs during EEG study: None  Technical aspects: This EEG study was done with scalp electrodes positioned according to the 10-20 International system of electrode placement. Electrical activity was reviewed with band pass filter of 1-70Hz , sensitivity of 7 uV/mm, display speed of 97mm/sec with a 60Hz  notched filter applied as appropriate. EEG data were recorded continuously and digitally stored.  Video monitoring was available and reviewed as appropriate.  Description: EEG showed continuous generalized background suppression, not reactive to stimulation. Hyperventilation and photic stimulation were not performed.     ABNORMALITY -Background suppression, generalized  IMPRESSION: This study is suggestive of profound diffuse encephalopathy. No seizures or epileptiform discharges were seen throughout the recording.  Kayal Mula O Uriel Horkey

## 2023-07-14 NOTE — Plan of Care (Signed)
  Problem: Cardiac: Goal: Ability to achieve and maintain adequate cardiopulmonary perfusion will improve Outcome: Progressing   Problem: Neurologic: Goal: Promote progressive neurologic recovery Outcome: Progressing   Problem: Skin Integrity: Goal: Risk for impaired skin integrity will be minimized. Outcome: Progressing   Problem: Clinical Measurements: Goal: Ability to maintain clinical measurements within normal limits will improve Outcome: Progressing Goal: Will remain free from infection Outcome: Progressing Goal: Diagnostic test results will improve Outcome: Progressing Goal: Respiratory complications will improve Outcome: Progressing Goal: Cardiovascular complication will be avoided Outcome: Progressing   Problem: Activity: Goal: Risk for activity intolerance will decrease Outcome: Progressing   Problem: Coping: Goal: Level of anxiety will decrease Outcome: Progressing   Problem: Elimination: Goal: Will not experience complications related to urinary retention Outcome: Progressing   Problem: Pain Managment: Goal: General experience of comfort will improve and/or be controlled Outcome: Progressing   Problem: Safety: Goal: Ability to remain free from injury will improve Outcome: Progressing   Problem: Skin Integrity: Goal: Risk for impaired skin integrity will decrease Outcome: Progressing   Problem: Fluid Volume: Goal: Ability to maintain a balanced intake and output will improve Outcome: Progressing   Problem: Health Behavior/Discharge Planning: Goal: Ability to identify and utilize available resources and services will improve Outcome: Progressing Goal: Ability to manage health-related needs will improve Outcome: Progressing   Problem: Metabolic: Goal: Ability to maintain appropriate glucose levels will improve Outcome: Progressing   Problem: Skin Integrity: Goal: Risk for impaired skin integrity will decrease Outcome: Progressing   Problem:  Tissue Perfusion: Goal: Adequacy of tissue perfusion will improve Outcome: Progressing   Problem: Activity: Goal: Ability to tolerate increased activity will improve Outcome: Progressing   Problem: Respiratory: Goal: Ability to maintain a clear airway and adequate ventilation will improve Outcome: Progressing

## 2023-07-14 NOTE — Consult Note (Signed)
 NEUROLOGY CONSULT NOTE   Date of service: July 14, 2023 Patient Name: Steven Hood MRN:  [id number] DOB:  2000/03/10 Chief Complaint: Unresponsiveness Requesting Provider: Kara Dorn NOVAK, MD  History of Present Illness  Steven Hood is a 23 y.o. male who was found down by family with an unknown downtime in the early afternoon of 7/4.  On arrival, he was hypothermic with a temperature of 87 and pH of 6.8.  He has not regained consciousness and therefore neurology has been consulted for consideration of anoxic injury   Past History   Past Medical History:  Diagnosis Date   Asthma    Cocaine abuse (HCC)    Herpes simplex 2023   Opioid abuse (HCC)    Pharyngitis 2024   Polysubstance abuse (HCC)     History reviewed. No pertinent surgical history.  Family History: History reviewed. No pertinent family history.  Social History  reports that he has never smoked. He has never used smokeless tobacco. He reports that he does not currently use alcohol. He reports current drug use. Drugs: Crack cocaine and Cocaine.  Allergies  Allergen Reactions   Peanut-Containing Drug Products Anaphylaxis   Shellfish Allergy Anaphylaxis    Medications   Current Facility-Administered Medications:    busPIRone  (BUSPAR ) tablet 30 mg, 30 mg, Oral, Q8H PRN, 30 mg at 07/14/23 1729 **OR** busPIRone  (BUSPAR ) tablet 30 mg, 30 mg, Per Tube, Q8H PRN, Harold Scholz, MD, 30 mg at 07/14/23 0742   calcium  gluconate 1 g/ 50 mL sodium chloride  IVPB, 1 g, Intravenous, Once, Dewald, Jonathan B, MD, Last Rate: 50 mL/hr at 07/14/23 1756, 1,000 mg at 07/14/23 1756   Chlorhexidine  Gluconate Cloth 2 % PADS 6 each, 6 each, Topical, Daily, Harold Scholz, MD, 6 each at 07/14/23 1030   docusate (COLACE) 50 MG/5ML liquid 100 mg, 100 mg, Per Tube, BID PRN, Millen, Jessica B, RPH   enoxaparin  (LOVENOX ) injection 40 mg, 40 mg, Subcutaneous, Q24H, Chand, Sudham, MD, 40 mg at 07/14/23 1420   EPINEPHrine   (ADRENALIN ) 1 MG/10ML injection, , , ,    EPINEPHrine  (ADRENALIN ) 5 mg in NS 250 mL (0.02 mg/mL) premix infusion, 0.5-20 mcg/min, Intravenous, Titrated, Chand, Sudham, MD, Last Rate: 51 mL/hr at 07/14/23 1612, 17 mcg/min at 07/14/23 1612   epoprostenol  (VELETRI ) for inhalation 1.5mg /26mL (30,000 ng/mL), 50 ng/kg/min (Ideal), Inhalation, Continuous, Harris, Whitney D, NP, Last Rate: 7.3 mL/hr at 07/14/23 1425, 50 ng/kg/min at 07/14/23 1425   famotidine  (PEPCID ) tablet 20 mg, 20 mg, Per Tube, Daily, Chand, Sudham, MD, 20 mg at 07/14/23 0853   fentaNYL  (SUBLIMAZE ) injection 50-200 mcg, 50-200 mcg, Intravenous, Q30 min PRN, Chand, Sudham, MD, 50 mcg at 07/14/23 0426   hydrocortisone  sodium succinate  (SOLU-CORTEF ) 100 MG injection 100 mg, 100 mg, Intravenous, Q12H, Chand, Sudham, MD, 100 mg at 07/14/23 1800   insulin  aspart (novoLOG ) injection 0-15 Units, 0-15 Units, Subcutaneous, Q4H, Paliwal, Aditya, MD, 2 Units at 07/14/23 1703   insulin  glargine-yfgn (SEMGLEE ) injection 5 Units, 5 Units, Subcutaneous, QHS, Paliwal, Aditya, MD, 5 Units at 07/14/23 0025   lactated ringers  infusion, , Intravenous, Continuous, Kara Dorn NOVAK, MD, Last Rate: 125 mL/hr at 07/14/23 1718, New Bag at 07/14/23 1718   magnesium  sulfate IVPB 2 g 50 mL, 2 g, Intravenous, Once PRN, Harold Scholz, MD   mupirocin  ointment (BACTROBAN ) 2 % 1 Application, 1 Application, Nasal, BID, Harold Scholz, MD, 1 Application at 07/14/23 0853   norepinephrine  (LEVOPHED ) 4mg  in (0.016 mg/mL) premix infusion, 0-40 mcg/min, Intravenous, Titrated, Mannie,  Fairy DASEN, DO, Last Rate: 63.8 mL/hr at 07/14/23 1515, 17 mcg/min at 07/14/23 1515   ondansetron  (ZOFRAN ) injection 4 mg, 4 mg, Intravenous, Q6H PRN, Harold Scholz, MD   Oral care mouth rinse, 15 mL, Mouth Rinse, Q2H, Chand, Sudham, MD, 15 mL at 07/14/23 1746   Oral care mouth rinse, 15 mL, Mouth Rinse, PRN, Harold Scholz, MD   piperacillin -tazobactam (ZOSYN ) IVPB 3.375 g, 3.375 g,  Intravenous, Q8H, Millen, Jessica B, RPH, Last Rate: 12.5 mL/hr at 07/14/23 1421, 3.375 g at 07/14/23 1421   polyethylene glycol (MIRALAX  / GLYCOLAX ) packet 17 g, 17 g, Per Tube, Daily PRN, Harold Scholz, MD   potassium chloride  10 mEq in 50 mL *CENTRAL LINE* IVPB, 10 mEq, Intravenous, Q1 Hr x 4, Dewald, Jonathan B, MD, Last Rate: 50 mL/hr at 07/14/23 1719, 10 mEq at 07/14/23 1719   vancomycin  (VANCOREADY) IVPB 1500 mg/300 mL, 1,500 mg, Intravenous, Q24H, Zelphia Randall PEDLAR, RPH, Last Rate: 150 mL/hr at 07/14/23 1557, 1,500 mg at 07/14/23 1557   vasopressin  (PITRESSIN) 20 Units in 100 mL (0.2 unit/mL) infusion-*FOR SHOCK*, 0-0.04 Units/min, Intravenous, Continuous, Mannie Fairy T, DO, Last Rate: 12 mL/hr at 07/14/23 1223, 0.04 Units/min at 07/14/23 1223  Vitals   Vitals:   07/14/23 1715 07/14/23 1730 07/14/23 1745 07/14/23 1800  BP:      Pulse: (!) 109 (!) 110 (!) 112 (!) 109  Resp: (!) 30 (!) 30 (!) 30 (!) 30  Temp: 98.4 F (36.9 C) 98.4 F (36.9 C) 98.2 F (36.8 C) 98.2 F (36.8 C)  TempSrc:      SpO2: 96% 100% 96% 96%  Weight:      Height:        Body mass index is 25.5 kg/m.   Physical Exam   In bed, intubated  Neurologic Examination    Neuro: Mental Status: He does not open eyes or follow commands Cranial Nerves: II: Pupils are equal, round, and reactive to light.   III,IV, VI: No response to doll's maneuver V: VII: Corneals are absent X: Cough is absent Motor: He has minimal extensor posturing to Nox stimulation in bilateral upper extremities, no response in lower extremities Sensory: As above  Cerebellar: Does not perform       Labs/Imaging/Neurodiagnostic studies   CBC:  Recent Labs  Lab 26-Jul-2023 1254 26-Jul-2023 1305 07/14/23 0508 07/14/23 1244 07/14/23 1732  WBC 14.6*  --  3.5*  --   --   NEUTROABS 8.5*  --   --   --   --   HGB 13.4   < > 15.7 15.3 14.6  HCT 45.6   < > 46.9 45.0 43.0  MCV 102.2*  --  89.7  --   --   PLT 299  --  233  --   --     < > = values in this interval not displayed.   Basic Metabolic Panel:  Lab Results  Component Value Date   NA 140 07/14/2023   K 3.0 (L) 07/14/2023   CO2 28 07/14/2023   GLUCOSE 163 (H) 07/14/2023   BUN 25 (H) 07/14/2023   CREATININE 2.25 (H) 07/14/2023   CALCIUM  7.1 (L) 07/14/2023   GFRNONAA 41 (L) 07/14/2023   Lipid Panel: No results found for: LDLCALC HgbA1c:  Lab Results  Component Value Date   HGBA1C 5.1 07/26/2023   Urine Drug Screen:     Component Value Date/Time   LABOPIA NONE DETECTED 07-26-2023 1308   COCAINSCRNUR POSITIVE (A) 07/26/23 1308   LABBENZ  NONE DETECTED 07/13/2023 1308   AMPHETMU NONE DETECTED 07/13/2023 1308   THCU POSITIVE (A) 07/13/2023 1308   LABBARB NONE DETECTED 07/13/2023 1308    Alcohol Level     Component Value Date/Time   [facility] <15 07/13/2023 1254   INR  Lab Results  Component Value Date   INR 1.2 07/14/2023    CT Head without contrast(Personally reviewed): I suspect there may be some dulling of the thalami, but this is not definite  ASSESSMENT   Other J Cabell is a 23 y.o. male with likely anoxic brain injury, but with no definitive findings thus far.  If he continues with his current clinical exam at 72 hours, this would be concerning for poor prognosis.  His EEG is continuous, and I would favor getting MRI in the 3 to 5-day range as well.  RECOMMENDATIONS  MRI brain on Monday Continue overnight EEG, if no seizures by tomorrow can discontinue Neurology will continue to follow ______________________________________________________________________  This patient is critically ill and at significant risk of neurological worsening, death and care requires constant monitoring of vital signs, hemodynamics,respiratory and cardiac monitoring, neurological assessment, discussion with family, other specialists and medical decision making of high complexity. I spent 34 minutes of neurocritical care time  in the care of  this patient.  This was time spent independent of any time provided by nurse practitioner or PA.  Aisha Seals, MD [facility]   If 7pm- 7am, please page neurology on call as listed in AMION. 07/14/2023  6:35 PM

## 2023-07-14 NOTE — Progress Notes (Signed)
 Pt remains prone. Pt's head turned approximately every 2 hours throughout the night without any complications. Pt ETT secured with cloth tape. Pt head to the right and right arm in swimmers position. RT able to pass suction catheter.

## 2023-07-14 NOTE — Progress Notes (Signed)
 LTM VIDEO EEG hooked up and running - no initial skin breakdown - push button tested - [facility] is monitoring.

## 2023-07-14 NOTE — Progress Notes (Signed)
 Echocardiogram 2D Echocardiogram has been performed.  Danyele Smejkal N Thayer Inabinet,RDCS 07/14/2023, 11:19 AM

## 2023-07-14 NOTE — Progress Notes (Signed)
 RT turned pt head to the left with left arm in swimmers position. RT was able to pass the suction catheter, with no issues noted. RN at bedside.

## 2023-07-14 NOTE — Progress Notes (Signed)
 RT x2 and RN x3 supined pt, with no issues. Pt ETT re-secured with tube holder.

## 2023-07-14 NOTE — Progress Notes (Signed)
 Pharmacy Antibiotic Note  Steven Hood is a 23 y.o. male admitted post CPR and another brief CPR in ED on 07/13/2023 with pneumonia.  Pharmacy has been consulted for Zosyn  dosing.  Initial SCr elevated at 2.30. CrCl ~ 52 mL/min, now Scr 2.29, essentially unchanged. Still in shock on epinephrine , norepinephrine , vasopressin , hydrocortisone , and inhaled epo for ARDS.   Plan: Zosyn  3.375g IV q8h (4 hour infusion). Vanc 1500 mg IV q24h (1750 mg given in ED on 7/4) eAUC 541, Vt 12.9 with Scr 2.29, Vd 0.7 F/u renal function, levels at steady state, additional culture data for speciation/sensitivities  Height: 5' 10 (177.8 cm) Weight: 80.6 kg (177 lb 11.1 oz) IBW/kg (Calculated) : 73  Temp (24hrs), Avg:97.3 F (36.3 C), Min:87.8 F (31 C), Max:102.7 F (39.3 C)  Recent Labs  Lab 07/13/23 1254 07/13/23 1305 07/13/23 1406 07/13/23 1544 07/13/23 1954 07/13/23 2235 07/14/23 0508  WBC 14.6*  --   --   --   --   --  3.5*  CREATININE 2.25* 2.00* 2.30*  --   --   --  2.29*  LATICACIDVEN  --   --   --  >9.0* 6.8* 6.2* 5.3*    Estimated Creatinine Clearance: 52.2 mL/min (A) (by C-G formula based on SCr of 2.29 mg/dL (H)).    Allergies  Allergen Reactions   Peanut-Containing Drug Products Anaphylaxis   Shellfish Allergy Anaphylaxis    Antimicrobials this admission: Zosyn  7/4 >> Vanc 7/4 >>  Microbiology results: 7/4 Resp cx: GPC, GNR 7/4 MRSA PCR: positive  Thank you for allowing pharmacy to be a part of this patient's care.  Randall Call, PharmD, BCPS 07/14/23 1:21 PM

## 2023-07-15 ENCOUNTER — Inpatient Hospital Stay (HOSPITAL_COMMUNITY)

## 2023-07-15 DIAGNOSIS — Z7189 Other specified counseling: Secondary | ICD-10-CM

## 2023-07-15 DIAGNOSIS — I5021 Acute systolic (congestive) heart failure: Secondary | ICD-10-CM | POA: Diagnosis not present

## 2023-07-15 DIAGNOSIS — R569 Unspecified convulsions: Secondary | ICD-10-CM | POA: Diagnosis not present

## 2023-07-15 DIAGNOSIS — I469 Cardiac arrest, cause unspecified: Secondary | ICD-10-CM | POA: Diagnosis not present

## 2023-07-15 DIAGNOSIS — Z515 Encounter for palliative care: Secondary | ICD-10-CM

## 2023-07-15 DIAGNOSIS — J80 Acute respiratory distress syndrome: Secondary | ICD-10-CM | POA: Diagnosis not present

## 2023-07-15 DIAGNOSIS — I5020 Unspecified systolic (congestive) heart failure: Secondary | ICD-10-CM | POA: Diagnosis not present

## 2023-07-15 DIAGNOSIS — R7989 Other specified abnormal findings of blood chemistry: Secondary | ICD-10-CM | POA: Diagnosis not present

## 2023-07-15 DIAGNOSIS — R579 Shock, unspecified: Secondary | ICD-10-CM

## 2023-07-15 DIAGNOSIS — G931 Anoxic brain damage, not elsewhere classified: Secondary | ICD-10-CM | POA: Diagnosis not present

## 2023-07-15 LAB — CBC
HCT: 39.6 % (ref 39.0–52.0)
Hemoglobin: 13.5 g/dL (ref 13.0–17.0)
MCH: 29.5 pg (ref 26.0–34.0)
MCHC: 34.1 g/dL (ref 30.0–36.0)
MCV: 86.7 fL (ref 80.0–100.0)
Platelets: 163 K/uL (ref 150–400)
RBC: 4.57 MIL/uL (ref 4.22–5.81)
RDW: 13.2 % (ref 11.5–15.5)
WBC: 17.2 K/uL — ABNORMAL HIGH (ref 4.0–10.5)
nRBC: 0.2 % (ref 0.0–0.2)

## 2023-07-15 LAB — PROTIME-INR
INR: 1.6 — ABNORMAL HIGH (ref 0.8–1.2)
Prothrombin Time: 19.7 s — ABNORMAL HIGH (ref 11.4–15.2)

## 2023-07-15 LAB — COMPREHENSIVE METABOLIC PANEL WITH GFR
ALT: 1651 U/L — ABNORMAL HIGH (ref 0–44)
ALT: 1407 U/L — ABNORMAL HIGH (ref 0–44)
AST: 1292 U/L — ABNORMAL HIGH (ref 15–41)
AST: 1073 U/L — ABNORMAL HIGH (ref 15–41)
Albumin: 2.3 g/dL — ABNORMAL LOW (ref 3.5–5.0)
Albumin: 2.2 g/dL — ABNORMAL LOW (ref 3.5–5.0)
Alkaline Phosphatase: 71 U/L (ref 38–126)
Alkaline Phosphatase: 67 U/L (ref 38–126)
Anion gap: 13 (ref 5–15)
Anion gap: 13 (ref 5–15)
BUN: 25 mg/dL — ABNORMAL HIGH (ref 6–20)
BUN: 24 mg/dL — ABNORMAL HIGH (ref 6–20)
CO2: 27 mmol/L (ref 22–32)
CO2: 26 mmol/L (ref 22–32)
Calcium: 7 mg/dL — ABNORMAL LOW (ref 8.9–10.3)
Calcium: 7.7 mg/dL — ABNORMAL LOW (ref 8.9–10.3)
Chloride: 98 mmol/L (ref 98–111)
Chloride: 96 mmol/L — ABNORMAL LOW (ref 98–111)
Creatinine, Ser: 2.07 mg/dL — ABNORMAL HIGH (ref 0.61–1.24)
Creatinine, Ser: 1.96 mg/dL — ABNORMAL HIGH (ref 0.61–1.24)
GFR, Estimated: 46 mL/min — ABNORMAL LOW (ref >60)
GFR, Estimated: 49 mL/min — ABNORMAL LOW (ref >60)
Glucose, Bld: 99 mg/dL (ref 70–99)
Glucose, Bld: 85 mg/dL (ref 70–99)
Potassium: 3.6 mmol/L (ref 3.5–5.1)
Potassium: 3.4 mmol/L — ABNORMAL LOW (ref 3.5–5.1)
Sodium: 138 mmol/L (ref 135–145)
Sodium: 135 mmol/L (ref 135–145)
Total Bilirubin: 2.2 mg/dL — ABNORMAL HIGH (ref 0.0–1.2)
Total Bilirubin: 2.2 mg/dL — ABNORMAL HIGH (ref 0.0–1.2)
Total Protein: 5.1 g/dL — ABNORMAL LOW (ref 6.5–8.1)
Total Protein: 4.9 g/dL — ABNORMAL LOW (ref 6.5–8.1)

## 2023-07-15 LAB — GLUCOSE, CAPILLARY
Glucose-Capillary: 109 mg/dL — ABNORMAL HIGH (ref 70–99)
Glucose-Capillary: 110 mg/dL — ABNORMAL HIGH (ref 70–99)
Glucose-Capillary: 118 mg/dL — ABNORMAL HIGH (ref 70–99)
Glucose-Capillary: 73 mg/dL (ref 70–99)
Glucose-Capillary: 81 mg/dL (ref 70–99)
Glucose-Capillary: 82 mg/dL (ref 70–99)
Glucose-Capillary: 85 mg/dL (ref 70–99)
Glucose-Capillary: 88 mg/dL (ref 70–99)

## 2023-07-15 LAB — POCT I-STAT 7, (LYTES, BLD GAS, ICA,H+H)
Acid-Base Excess: 6 mmol/L — ABNORMAL HIGH (ref 0.0–2.0)
Acid-Base Excess: 7 mmol/L — ABNORMAL HIGH (ref 0.0–2.0)
Bicarbonate: 29.2 mmol/L — ABNORMAL HIGH (ref 20.0–28.0)
Bicarbonate: 30.7 mmol/L — ABNORMAL HIGH (ref 20.0–28.0)
Calcium, Ion: 0.91 mmol/L — ABNORMAL LOW (ref 1.15–1.40)
Calcium, Ion: 1.03 mmol/L — ABNORMAL LOW (ref 1.15–1.40)
HCT: 37 % — ABNORMAL LOW (ref 39.0–52.0)
HCT: 35 % — ABNORMAL LOW (ref 39.0–52.0)
Hemoglobin: 12.6 g/dL — ABNORMAL LOW (ref 13.0–17.0)
Hemoglobin: 11.9 g/dL — ABNORMAL LOW (ref 13.0–17.0)
O2 Saturation: 100 %
O2 Saturation: 100 %
Patient temperature: 37.2
Patient temperature: 36.6
Potassium: 3.5 mmol/L (ref 3.5–5.1)
Potassium: 3.6 mmol/L (ref 3.5–5.1)
Sodium: 137 mmol/L (ref 135–145)
Sodium: 136 mmol/L (ref 135–145)
TCO2: 30 mmol/L (ref 22–32)
TCO2: 32 mmol/L (ref 22–32)
pCO2 arterial: 37.1 mmHg (ref 32–48)
pCO2 arterial: 38 mmHg (ref 32–48)
pH, Arterial: 7.504 — ABNORMAL HIGH (ref 7.35–7.45)
pH, Arterial: 7.514 — ABNORMAL HIGH (ref 7.35–7.45)
pO2, Arterial: 277 mmHg — ABNORMAL HIGH (ref 83–108)
pO2, Arterial: 154 mmHg — ABNORMAL HIGH (ref 83–108)

## 2023-07-15 LAB — BLOOD GAS, ARTERIAL
Acid-Base Excess: 7.5 mmol/L — ABNORMAL HIGH (ref 0.0–2.0)
Bicarbonate: 31.1 mmol/L — ABNORMAL HIGH (ref 20.0–28.0)
Drawn by: NEGATIVE
O2 Saturation: 98.5 %
Patient temperature: 36.9
pCO2 arterial: 39 mmHg (ref 32–48)
pH, Arterial: 7.51 — ABNORMAL HIGH (ref 7.35–7.45)
pO2, Arterial: 187 mmHg — ABNORMAL HIGH (ref 83–108)

## 2023-07-15 LAB — CULTURE, RESPIRATORY W GRAM STAIN

## 2023-07-15 LAB — MAGNESIUM
Magnesium: 1.2 mg/dL — ABNORMAL LOW (ref 1.7–2.4)
Magnesium: 2.4 mg/dL (ref 1.7–2.4)

## 2023-07-15 LAB — CK TOTAL AND CKMB (NOT AT ARMC)
CK, MB: 402.4 ng/mL — ABNORMAL HIGH (ref 0.5–5.0)
Total CK: >20000 U/L — ABNORMAL HIGH (ref 49–397)

## 2023-07-15 LAB — PHOSPHORUS
Phosphorus: 4.9 mg/dL — ABNORMAL HIGH (ref 2.5–4.6)
Phosphorus: 5.4 mg/dL — ABNORMAL HIGH (ref 2.5–4.6)

## 2023-07-15 MED ORDER — MAGNESIUM SULFATE 4 GM/100ML IV SOLN
4.0000 g | Freq: Once | INTRAVENOUS | Status: AC
  Administered 2023-07-15: 4 g via INTRAVENOUS
  Filled 2023-07-15: qty 100

## 2023-07-15 MED ORDER — VITAL HP 1.0 CAL PO LIQD
1000.0000 mL | ORAL | Status: DC
  Administered 2023-07-15: 1000 mL
  Filled 2023-07-15: qty 1000

## 2023-07-15 MED ORDER — ARTIFICIAL TEARS OPHTHALMIC OINT
TOPICAL_OINTMENT | OPHTHALMIC | Status: DC | PRN
  Administered 2023-07-15: 1 via OPHTHALMIC
  Filled 2023-07-15: qty 3.5

## 2023-07-15 MED ORDER — LINEZOLID 600 MG/300ML IV SOLN
600.0000 mg | Freq: Two times a day (BID) | INTRAVENOUS | Status: DC
  Administered 2023-07-15 – 2023-07-16 (×2): 600 mg via INTRAVENOUS
  Filled 2023-07-15 (×2): qty 300

## 2023-07-15 MED ORDER — CALCIUM GLUCONATE-NACL 2-0.675 GM/100ML-% IV SOLN
2.0000 g | Freq: Once | INTRAVENOUS | Status: AC
  Administered 2023-07-15: 2000 mg via INTRAVENOUS
  Filled 2023-07-15: qty 100

## 2023-07-15 MED ORDER — NOREPINEPHRINE 16 MG/250ML-% IV SOLN
0.0000 ug/min | INTRAVENOUS | Status: DC
  Administered 2023-07-15: 30 ug/min via INTRAVENOUS
  Administered 2023-07-16: 9 ug/min via INTRAVENOUS
  Filled 2023-07-15 (×2): qty 250

## 2023-07-15 MED ORDER — POTASSIUM CHLORIDE 20 MEQ PO PACK
40.0000 meq | PACK | Freq: Once | ORAL | Status: AC
  Administered 2023-07-15: 40 meq
  Filled 2023-07-15: qty 2

## 2023-07-15 MED ORDER — VITAL HP 1.0 CAL PO LIQD: 1000.0000 mL | ORAL | Status: DC

## 2023-07-15 MED ORDER — POTASSIUM CHLORIDE 20 MEQ PO PACK: 40.0000 meq | PACK | Freq: Once | ORAL | Status: DC

## 2023-07-15 MED ORDER — LEVETIRACETAM (KEPPRA) 500 MG/5 ML ADULT IV PUSH
500.0000 mg | Freq: Two times a day (BID) | INTRAVENOUS | Status: DC
  Administered 2023-07-15 – 2023-07-16 (×3): 500 mg via INTRAVENOUS
  Filled 2023-07-15 (×3): qty 5

## 2023-07-15 MED ORDER — PROSOURCE TF20 ENFIT COMPATIBL EN LIQD
60.0000 mL | Freq: Every day | ENTERAL | Status: DC
Start: 2023-07-15 — End: 2023-07-16
  Administered 2023-07-15 – 2023-07-16 (×2): 60 mL
  Filled 2023-07-15 (×2): qty 60

## 2023-07-15 NOTE — Progress Notes (Signed)
LTM maint complete - no skin breakdown under:  Fp1, Fp2

## 2023-07-15 NOTE — Progress Notes (Signed)
 NEUROLOGY CONSULT FOLLOW UP NOTE   Date of service: July 15, 2023 Patient Name: Steven Hood MRN:  [id number] DOB:  2000-06-05  Interval Hx/subjective   Slightly worse today Vitals   Vitals:   07/15/23 1745 07/15/23 1800 07/15/23 1815 07/15/23 1830  BP:      Pulse: 90 91 90 88  Resp: (!) 24 (!) 24 (!) 24 (!) 21  Temp: 98.1 F (36.7 C) 98.1 F (36.7 C) 98.1 F (36.7 C) 97.9 F (36.6 C)  TempSrc:      SpO2: 100% 100% 98% 99%  Weight:      Height:         Body mass index is 25.5 kg/m.  Physical Exam   Constitutional: Appears well-developed and well-nourished.  Neurologic Examination    MS: Does not open eyes or follow commands CN: Pupils are not dilated, but minimally reactive, corneals are absent, no response to doll's maneuver, no cough Motor: No response to noxious stimulation Sensory: As above  Medications  Current Facility-Administered Medications:    artificial tears (LACRILUBE) ophthalmic ointment, , Both Eyes, Q4H PRN, Kara Dorn NOVAK, MD, 1 Application at 07/15/23 1732   Chlorhexidine  Gluconate Cloth 2 % PADS 6 each, 6 each, Topical, Daily, Harold Scholz, MD, 6 each at 07/15/23 1200   docusate (COLACE) 50 MG/5ML liquid 100 mg, 100 mg, Per Tube, BID PRN, Millen, Jessica B, RPH   enoxaparin  (LOVENOX ) injection 40 mg, 40 mg, Subcutaneous, Q24H, Chand, Sudham, MD, 40 mg at 07/15/23 1354   EPINEPHrine  (ADRENALIN ) 5 mg in NS 250 mL (0.02 mg/mL) premix infusion, 0.5-20 mcg/min, Intravenous, Titrated, Harold Scholz, MD, Stopped at 07/14/23 2227   epoprostenol  (VELETRI ) for inhalation 1.5mg /20mL (30,000 ng/mL), 50 ng/kg/min (Ideal), Inhalation, Continuous, Harris, Whitney D, NP, Last Rate: 7.3 mL/hr at 07/15/23 1614, 50 ng/kg/min at 07/15/23 1614   famotidine  (PEPCID ) tablet 20 mg, 20 mg, Per Tube, Daily, Chand, Sudham, MD, 20 mg at 07/15/23 0950   feeding supplement (PROSource TF20) liquid 60 mL, 60 mL, Per Tube, Daily, Harris, Whitney D, NP, 60 mL at 07/15/23  1352   feeding supplement (VITAL HIGH PROTEIN) liquid 1,000 mL, 1,000 mL, Per Tube, Q24H, Harris, Whitney D, NP, Infusion Verify at 07/15/23 1800   fentaNYL  (SUBLIMAZE ) injection 50-200 mcg, 50-200 mcg, Intravenous, Q30 min PRN, Harold Scholz, MD, 50 mcg at 07/14/23 0426   hydrocortisone  sodium succinate  (SOLU-CORTEF ) 100 MG injection 100 mg, 100 mg, Intravenous, Q12H, Chand, Sudham, MD, 100 mg at 07/15/23 1822   insulin  aspart (novoLOG ) injection 0-15 Units, 0-15 Units, Subcutaneous, Q4H, Paliwal, Aditya, MD, 2 Units at 07/14/23 1703   levETIRAcetam  (KEPPRA ) undiluted injection 500 mg, 500 mg, Intravenous, BID, Kara Dorn B, MD, 500 mg at 07/15/23 1119   linezolid  (ZYVOX ) IVPB 600 mg, 600 mg, Intravenous, Q12H, Kara Dorn NOVAK, MD, Stopped at 07/15/23 1617   mupirocin  ointment (BACTROBAN ) 2 % 1 Application, 1 Application, Nasal, BID, Chand, Sudham, MD, 1 Application at 07/15/23 0949   norepinephrine  (LEVOPHED ) 16 mg in (0.064 mg/mL) premix infusion, 0-40 mcg/min, Intravenous, Titrated, Kara Dorn B, MD, Last Rate: 7.5 mL/hr at 07/15/23 1800, 8 mcg/min at 07/15/23 1800   ondansetron  (ZOFRAN ) injection 4 mg, 4 mg, Intravenous, Q6H PRN, Harold Scholz, MD   Oral care mouth rinse, 15 mL, Mouth Rinse, Q2H, Chand, Sudham, MD, 15 mL at 07/15/23 1737   Oral care mouth rinse, 15 mL, Mouth Rinse, PRN, Chand, Sudham, MD   piperacillin -tazobactam (ZOSYN ) IVPB 3.375 g, 3.375 g, Intravenous, Q8H, Kara Dorn  B, MD, Stopped at 07/15/23 1727   polyethylene glycol (MIRALAX  / GLYCOLAX ) packet 17 g, 17 g, Per Tube, Daily PRN, Harold Scholz, MD   vasopressin  (PITRESSIN) 20 Units in 100 mL (0.2 unit/mL) infusion-*FOR SHOCK*, 0-0.04 Units/min, Intravenous, Continuous, Harris, Whitney D, NP, Last Rate: 9 mL/hr at 07/15/23 1800, 0.03 Units/min at 07/15/23 1800  Labs and Diagnostic Imaging    EEG currently with just slowing  Imaging(Personally reviewed): Initial CT head, I suspect there is  some indistinct findings in the thalami  Assessment   Steven Hood is a 23 y.o. male with anoxic brain injury.  With his worsening exam, my suspicion is that he likely will progress to brain death.  Currently he still has EEG activity which would preclude that diagnosis.  Currently the plan is to repeat MRI in the morning and I suspect he will likely progress to brain death in that timeframe as well.  If his exam is not improved, no corneals and no pupils at 72 hours in addition to no motor exam would be enough to establish dismal prognosis.  Recommendations  MRI in the morning Continue discussions with family Continue Keppra  ______________________________________________________________________  This patient is critically ill and at significant risk of neurological worsening, death and care requires constant monitoring of vital signs, hemodynamics,respiratory and cardiac monitoring, neurological assessment, discussion with family, other specialists and medical decision making of high complexity. I spent 32 minutes of neurocritical care time  in the care of  this patient. This was time spent independent of any time provided by nurse practitioner or PA.  Aisha Seals, MD [facility]   If 7pm- 7am, please page neurology on call as listed in AMION. 07/15/2023  6:47 PM

## 2023-07-15 NOTE — Progress Notes (Signed)
 PCCM Update:  Notified by nursing of a pupillary exam change. On exam patient's pupils are now dilated, fixed and unresponsive to light. Discussed with neurology. Plant for MRI brain tomorrow morning as scheduled.  Dorn Chill, MD [facility] Office: [phone]   See Amion for personal pager PCCM on call pager [phone] until 7pm. Please call Elink 7p-7a. [phone]

## 2023-07-15 NOTE — Procedures (Signed)
 Patient Name: Steven Hood  MRN: [id number]  Epilepsy Attending: Arlin MALVA Krebs  Referring Physician/Provider: Arloa Folks D, NP  Duration: 07/14/2023 1439 to 07/15/2023 1439  Patient history:  23 yo M s/p cardaic arrest. EEG to evaluate for seizure   Level of alertness: comatose   AEDs during EEG study: LEV   Technical aspects: This EEG study was done with scalp electrodes positioned according to the 10-20 International system of electrode placement. Electrical activity was reviewed with band pass filter of 1-70Hz , sensitivity of 7 uV/mm, display speed of 83mm/sec with a 60Hz  notched filter applied as appropriate. EEG data were recorded continuously and digitally stored.  Video monitoring was available and reviewed as appropriate.   Description: EEG showed continuous generalized 3-6hz  theta-delta slowing. Hyperventilation and photic stimulation were not performed.     Eight seizures were noted arising from left fronto-temporal region on 07/14/2023 between 1511 to 2107 . During seizure, eeg showed 5-6hz  theta slowing in left fronto-temporal region which then evolved to 2-3hz  delta slowing admixed with spikes and involved all of left hemisphere.  No clinical signs were noted. Average duration  of seizures was 1.5 to 3.5 minutes.   Three seizures were noted arising from right fronto-temporal region on 07/14/2023 at 1650, 1838 and 1932 . During seizure, eeg showed 5-6hz  theta slowing in right fronto-temporal region which then evolved to 2-3hz  delta slowing admixed with spikes and involved all of right hemisphere. No clinical signs were noted. Average duration  of seizures was 20 seconds to 2 minutes.   ABNORMALITY - Seizures without clinical signs, left fronto-temporal region - Seizures without clinical signs, right fronto-temporal region - Continuous slow, generalized   IMPRESSION: This study showed eight seizures without clinical signs arising from left fronto-temporal region and three  seizures without clinical signs arising from right fronto-temporal region  as described above. Last seizure was noted on 07/14/2023 at 2107.  Additionally there is severe diffuse encephalopathy.    Solaris Kram O [name]

## 2023-07-15 NOTE — Consult Note (Addendum)
 Cardiology Consultation   Patient ID: [name] MRN: [id number]; DOB: January 07, 2001  Admit date: 07/13/2023 Date of Consult: 07/15/2023  PCP:  No primary care provider on file.   [facility] Providers Cardiologist:  None        Patient Profile: Steven Hood is a 23 y.o. male with history of asthma who is being seen 07/15/2023 for the evaluation of elevated troponin and abnormal ECG at the request of Steven Lesches, NP.  History of Present Illness: Steven Hood was brought to the emergency department by EMS on 7/4.  Per EMS run sheet, they arrived to find patient lying supine on the floor of his bedroom with the fire department performing CPR.  Patient's initial rhythm with EMS was asystole.  A supraglottic airway was placed and patient continued to be managed with ACLS/CPR.  According to EMS notes from scene, patient had been seen normal about 30 minutes prior to being found unresponsive by family.  EMS was advised that patient does smoke marijuana and take Percocet recreationally.  Given this information, patient received Narcan by EMS.  After approximately 12 minutes of ACLS, patient was noted to have regained spontaneous circulation with inability to auscultate blood pressure.  Given this EMS initiated an epinephrine  infusion.  Upon moving patient to the ambulance it was noted that he had again lost pulses and CPR was resumed with 1 push of 1-10,000 epinephrine .  Patient again regained spontaneous circulation and maintained a palpable carotid pulse throughout transport to the emergency department.  In the emergency department, patient intubated.  Initial vital signs and labs in the emergency department show severe hypothermia with a temperature 87 as well as significant acidosis with a pH of 6.8.  Upon evaluation in the emergency department, patient suffered a third cardiac arrest after becoming progressively bradycardic with eventual loss of [name] on his arterial line.  He had  return of spontaneous circulation after a round of ACLS.  Patient was placed on a bicarbonate drip and initially treated with vasopressors x 3.  On patient's second day of admission, it is noted that patient was off of sedation with pinpoint pupils with sluggish response and intermittent twitching.    Today patient continues vancomycin  and Zosyn  for pneumonia coverage.  Continues vasopressin , Levophed , epinephrine  for shock.  Continues stress dose steroids.  Patient underwent EEG this morning, was noted with 8 seizures without clinical signs arising from left frontotemporal region and 3 seizures without clinical signs arising from right frontotemporal region with severe diffuse encephalopathy.  Per primary team notes, pupils unresponsive to light today with no cough reflex noted.  Their plan is to check a brain MRI tomorrow.  Echocardiogram shows an EF of 40 to 45% with septal wall motion abnormalities.  Given this, cardiology asked to consult on the patient.  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   Past Medical History:  Diagnosis Date   Asthma    Cocaine abuse (HCC)    Herpes simplex 2023   Opioid abuse (HCC)    Pharyngitis 2024   Polysubstance abuse (HCC)     History reviewed. No pertinent surgical history.   Home Medications:  Prior to Admission medications   Not on File    Scheduled Meds:  Chlorhexidine  Gluconate Cloth  6 each Topical Daily   enoxaparin  (LOVENOX ) injection  40 mg Subcutaneous Q24H   famotidine   20 mg Per Tube Daily  feeding supplement (PROSource TF20)  60 mL Per Tube Daily   feeding supplement (VITAL HIGH PROTEIN)  1,000 mL Per Tube Q24H   hydrocortisone  sod succinate (SOLU-CORTEF ) inj  100 mg Intravenous Q12H   insulin  aspart  0-15 Units Subcutaneous Q4H   levETIRAcetam   500 mg Intravenous BID   mupirocin  ointment  1 Application Nasal BID   mouth  rinse  15 mL Mouth Rinse Q2H   Continuous Infusions:  epinephrine  Stopped (07/14/23 2227)   epoprostenol  (VELETRI ) for inhalation 1.5mg /73mL (30,000 ng/mL) 50 ng/kg/min (07/15/23 1009)   lactated ringers  125 mL/hr at 07/15/23 1000   linezolid  (ZYVOX ) IV     norepinephrine  (LEVOPHED ) Adult infusion 18 mcg/min (07/15/23 1000)   piperacillin -tazobactam (ZOSYN )  IV 3.375 g (07/15/23 1327)   vasopressin  0.04 Units/min (07/15/23 1000)   PRN Meds: docusate, fentaNYL  (SUBLIMAZE ) injection, ondansetron  (ZOFRAN ) IV, mouth rinse, polyethylene glycol  Allergies:    Allergies  Allergen Reactions   Peanut-Containing Drug Products Anaphylaxis   Shellfish Allergy Anaphylaxis    Social History:   Social History   Socioeconomic History   Marital status: Single    Spouse name: Not on file   Number of children: Not on file   Years of education: Not on file   Highest education level: Not on file  Occupational History   Not on file  Tobacco Use   Smoking status: Never   Smokeless tobacco: Never  Substance and Sexual Activity   Alcohol use: Not Currently   Drug use: Yes    Types: Crack cocaine, Cocaine   Sexual activity: Not on file  Other Topics Concern   Not on file  Social History Narrative   Not on file   Social Drivers of Health   Financial Resource Strain: Not on file  Food Insecurity: No Food Insecurity (07/13/2023)   Hunger Vital Sign    Worried About Running Out of Food in the Last Year: Never true    Ran Out of Food in the Last Year: Never true  Transportation Needs: No Transportation Needs (07/13/2023)   PRAPARE - Administrator, Civil Service (Medical): No    Lack of Transportation (Non-Medical): No  Physical Activity: Not on file  Stress: Not on file  Social Connections: Not on file  Intimate Partner Violence: Patient Unable To Answer (07/13/2023)   Humiliation, Afraid, Rape, and Kick questionnaire    Fear of Current or Ex-Partner: Patient unable to answer     Emotionally Abused: Patient unable to answer    Physically Abused: Patient unable to answer    Sexually Abused: Patient unable to answer    Family History:   History reviewed. No pertinent family history.   ROS:  Please see the history of present illness.   All other ROS reviewed and negative.     Physical Exam/Data: Vitals:   07/15/23 1100 07/15/23 1115 07/15/23 1130 07/15/23 1134  BP: 120/81     Pulse: 100 98 100   Resp: (!) 28 (!) 28 (!) 28   Temp: (!) 97.5 F (36.4 C) (!) 97.5 F (36.4 C) 97.9 F (36.6 C)   TempSrc:    Bladder  SpO2: 100% 98% 98%   Weight:      Height:        Intake/Output Summary (Last 24 hours) at 07/15/2023 1351 Last data filed at 07/15/2023 1327 Gross per 24 hour  Intake 6004.74 ml  Output 2185 ml  Net 3819.74 ml      07/15/2023  4:34 AM 07/14/2023    5:00 AM 07/13/2023    4:00 PM  Last 3 Weights  Weight (lbs) 177 lb 11.1 oz 177 lb 11.1 oz 177 lb 11.1 oz  Weight (kg) 80.6 kg 80.6 kg 80.6 kg     Body mass index is 25.5 kg/m.  General:  unresponsive and intubated patient HEENT: fixed, small pupils non-reactive to light Neck: no JVD Vascular: No carotid bruits; Distal pulses 2+ bilaterally Cardiac:  normal S1, S2; tachycardic; no murmur  Lungs:  limited exam due to intubated/ventilated status. No significant pulmonary edema, wheezing, rhonchi noted. Ext: no edema Musculoskeletal:  No deformities Skin: warm and dry  Neuro:  unresponsive, fixed small pupils Psych: Unable to assess  EKG:  The EKG was personally reviewed and demonstrates:  sinus rhythm with non-specific ST elevation in V2-V3. Borderline elevation in V1 Telemetry:  Telemetry was personally reviewed and demonstrates:  sinus rhythm/sinus tachycardia  Relevant CV Studies: Cardiac Studies & Procedures   ______________________________________________________________________________________________     ECHOCARDIOGRAM  ECHOCARDIOGRAM COMPLETE  07/14/2023  Narrative ECHOCARDIOGRAM REPORT    Patient Name:   Steven Hood Date of Exam: 07/14/2023 Medical Rec #:  [id number]         Height:       70.0 in Accession #:    [id number]        Weight:       177.7 lb Date of Birth:  2000-12-20        BSA:          1.985 m Patient Age:    22 years          BP:           151/113 mmHg Patient Gender: M                 HR:           116 bpm. Exam Location:  Inpatient  Procedure: 2D Echo, Color Doppler and Cardiac Doppler (Both Spectral and Color Flow Doppler were utilized during procedure).  Indications:    Cardiac Arrest  History:        Patient has no prior history of Echocardiogram examinations. Polysubstance abuse.  Sonographer:    Logan Shove RDCS Referring Phys: [id number] SUDHAM CHAND  IMPRESSIONS   1. Left ventricular ejection fraction, by estimation, is 40 to 45%. The left ventricle has mildly decreased function. Left ventricular endocardial border not optimally defined to evaluate regional wall motion, there is relative septal hypokinesis. Left ventricular diastolic parameters are indeterminate. 2. Right ventricular systolic function is moderately reduced based on limited images. The right ventricular size is normal. 3. The mitral valve is grossly normal. Mild mitral valve regurgitation. 4. The aortic valve is tricuspid. Aortic valve regurgitation is not visualized. No aortic stenosis is present. Aortic valve mean gradient measures 2.0 mmHg.  Comparison(s): No prior Echocardiogram.  FINDINGS Left Ventricle: Left ventricular ejection fraction, by estimation, is 40 to 45%. The left ventricle has mildly decreased function. Left ventricular endocardial border not optimally defined to evaluate regional wall motion. The left ventricular internal cavity size was normal in size. There is no left ventricular hypertrophy. Left ventricular diastolic parameters are indeterminate.  Right Ventricle: The right ventricular size is normal.  Right vetricular wall thickness was not well visualized. Right ventricular systolic function is moderately reduced. Tricuspid regurgitation signal is inadequate for assessing PA pressure.  Left Atrium: Left atrial size was normal in size.  Right Atrium: Right atrial size was normal in size.  Pericardium: There is no evidence of pericardial effusion.  Mitral Valve: The mitral valve is grossly normal. Mild mitral valve regurgitation, with posteriorly-directed jet.  Tricuspid Valve: The tricuspid valve is grossly normal. Tricuspid valve regurgitation is trivial.  Aortic Valve: The aortic valve is tricuspid. Aortic valve regurgitation is not visualized. No aortic stenosis is present. Aortic valve mean gradient measures 2.0 mmHg. Aortic valve peak gradient measures 3.2 mmHg. Aortic valve area, by VTI measures 2.55 cm.  Pulmonic Valve: The pulmonic valve was not well visualized. Pulmonic valve regurgitation is not visualized.  Aorta: The aortic root is normal in size and structure.  Venous: IVC assessment for right atrial pressure unable to be performed due to mechanical ventilation.  IAS/Shunts: The interatrial septum was not well visualized.  Additional Comments: 3D was performed not requiring image post processing on an independent workstation and was indeterminate.   LEFT VENTRICLE PLAX 2D LVIDd:         5.30 cm      Diastology LVIDs:         3.30 cm      LV e' medial:    4.03 cm/s LV PW:         0.70 cm      LV E/e' medial:  12.7 LV IVS:        0.70 cm      LV e' lateral:   5.66 cm/s LVOT diam:     2.00 cm      LV E/e' lateral: 9.0 LV SV:         28 LV SV Index:   14 LVOT Area:     3.14 cm  LV Volumes (MOD) LV vol d, MOD A2C: 70.3 ml LV vol d, MOD A4C: 106.0 ml LV vol s, MOD A2C: 42.0 ml LV vol s, MOD A4C: 60.4 ml LV SV MOD A2C:     28.3 ml LV SV MOD A4C:     106.0 ml LV SV MOD BP:      36.6 ml  RIGHT VENTRICLE             IVC RV Basal diam:  3.40 cm     IVC diam: 1.30  cm RV S prime:     12.00 cm/s TAPSE (M-mode): 1.9 cm  LEFT ATRIUM             Index       RIGHT ATRIUM          Index LA diam:        2.20 cm 1.11 cm/m  RA Area:     9.83 cm LA Vol (A2C):   13.9 ml 7.00 ml/m  RA Volume:   18.00 ml 9.07 ml/m LA Vol (A4C):   10.6 ml 5.34 ml/m LA Biplane Vol: 13.2 ml 6.65 ml/m AORTIC VALVE AV Area (Vmax):    3.01 cm AV Area (Vmean):   2.97 cm AV Area (VTI):     2.55 cm AV Vmax:           89.40 cm/s AV Vmean:          59.600 cm/s AV VTI:            0.111 m AV Peak Grad:      3.2 mmHg AV Mean Grad:      2.0 mmHg LVOT Vmax:         85.60 cm/s LVOT Vmean:        56.300 cm/s LVOT VTI:  0.090 m LVOT/AV VTI ratio: 0.81  AORTA Ao Root diam: 2.70 cm  MITRAL VALVE MV Area (PHT): 3.83 cm    SHUNTS MV Decel Time: 198 msec    Systemic VTI:  0.09 m MV E velocity: 51.20 cm/s  Systemic Diam: 2.00 cm MV A velocity: 55.70 cm/s MV E/A ratio:  0.92  Jayson Sierras MD Electronically signed by Jayson Sierras MD Signature Date/Time: 07/14/2023/5:20:05 PM    Final          ______________________________________________________________________________________________       Laboratory Data: High Sensitivity Troponin:   Recent Labs  Lab 07/13/23 1349 07/13/23 1954 07/14/23 0508  TROPONINIHS 1,277* 2,777* 13,648*     Chemistry Recent Labs  Lab 07/14/23 0508 07/14/23 1244 07/14/23 1433 07/14/23 1732 07/15/23 0526 07/15/23 0834 07/15/23 1034 07/15/23 1247  NA 144   < > 140   < > 138  --  137 136  K 3.1*   < > 2.9*   < > 3.6  --  3.5 3.6  CL 102  --  97*  --  98  --   --   --   CO2 29  --  28  --  27  --   --   --   GLUCOSE 139*  --  163*  --  99  --   --   --   BUN 28*  --  25*  --  25*  --   --   --   CREATININE 2.29*  --  2.25*  --  2.07*  --   --   --   CALCIUM  7.4*  --  7.1*  --  7.0*  --   --   --   MG 1.3*  --   --   --   --  1.2*  --   --   GFRNONAA 40*  --  41*  --  46*  --   --   --   ANIONGAP 13  --  15  --   13  --   --   --    < > = values in this interval not displayed.    Recent Labs  Lab 07/14/23 0508 07/14/23 1433 07/15/23 0526  PROT 5.1* 5.0* 5.1*  ALBUMIN 2.6* 2.4* 2.3*  AST 2,160* 1,642* 1,292*  ALT 2,189* 2,014* 1,651*  ALKPHOS 70 73 71  BILITOT 1.8* 1.8* 2.2*   Lipids No results for input(s): CHOL, TRIG, HDL, LABVLDL, LDLCALC, CHOLHDL in the last 168 hours.  Hematology Recent Labs  Lab 07/13/23 1254 07/13/23 1305 07/14/23 0508 07/14/23 1244 07/15/23 0526 07/15/23 1034 07/15/23 1247  WBC 14.6*  --  3.5*  --  17.2*  --   --   RBC 4.46  --  5.23  --  4.57  --   --   HGB 13.4   < > 15.7   < > 13.5 12.6* 11.9*  HCT 45.6   < > 46.9   < > 39.6 37.0* 35.0*  MCV 102.2*  --  89.7  --  86.7  --   --   MCH 30.0  --  30.0  --  29.5  --   --   MCHC 29.4*  --  33.5  --  34.1  --   --   RDW 13.1  --  13.2  --  13.2  --   --   PLT 299  --  233  --  163  --   --    < > =  values in this interval not displayed.   Thyroid No results for input(s): TSH, FREET4 in the last 168 hours.  BNPNo results for input(s): BNP, PROBNP in the last 168 hours.  DDimer No results for input(s): DDIMER in the last 168 hours.  Radiology/Studies:  DG CHEST PORT 1 VIEW Result Date: 07/15/2023 CLINICAL DATA:  [id number] Respiratory failure [facility]) [phone] ARDS (adult respiratory distress syndrome) (HCC) [phone] EXAM: PORTABLE CHEST - 1 VIEW COMPARISON:  July 13, 2023 FINDINGS: Endotracheal tube terminates in the mid trachea. Esophagogastric tube courses below the diaphragm with the distal tip not included in the field of view. Improved aeration of the lungs with persistent patchy airspace opacities throughout the right lung, predominantly in the right lower lobe. No pneumothorax or pleural effusion. No cardiomegaly. IMPRESSION: 1. Improved aeration with persistent airspace opacities in the right lung, most pronounced in the right lung base. 2. Similar positioning of the support tubes and lines, as  described above. Electronically Signed   By: Rogelia Myers M.D.   On: 07/15/2023 13:35   Overnight EEG with video Result Date: 07/15/2023 Shelton Arlin KIDD, MD     07/15/2023  7:14 AM Patient Name: Steven Hood MRN: [id number] Epilepsy Attending: Arlin KIDD Shelton Referring Physician/Provider: Arloa Folks D, NP Duration: 07/14/2023 1439 to 07/15/2023 0645 Patient history:  23 yo M s/p cardaic arrest. EEG to evaluate for seizure  Level of alertness: comatose  AEDs during EEG study: LEV  Technical aspects: This EEG study was done with scalp electrodes positioned according to the 10-20 International system of electrode placement. Electrical activity was reviewed with band pass filter of 1-70Hz , sensitivity of 7 uV/mm, display speed of 32mm/sec with a 60Hz  notched filter applied as appropriate. EEG data were recorded continuously and digitally stored.  Video monitoring was available and reviewed as appropriate.  Description: EEG showed continuous generalized 3-6hz  theta-delta slowing. Hyperventilation and photic stimulation were not performed.   Eight seizures were noted arising from left fronto-temporal region on 07/14/2023 between 1511 to 2107 . During seizure, eeg showed 5-6hz  theta slowing in left fronto-temporal region which then evolved to 2-3hz  delta slowing admixed with spikes and involved all of left hemisphere.  No clinical signs were noted. Average duration  of seizures was 1.5 to 3.5 minutes. Three seizures were noted arising from right fronto-temporal region on 07/14/2023 at 1650, 1838 and 1932 . During seizure, eeg showed 5-6hz  theta slowing in right fronto-temporal region which then evolved to 2-3hz  delta slowing admixed with spikes and involved all of right hemisphere. No clinical signs were noted. Average duration  of seizures was 20 seconds to 2 minutes.  ABNORMALITY - Seizures without clinical signs, left fronto-temporal region - Seizures without clinical signs, right fronto-temporal region -  Continuous slow, generalized  IMPRESSION: This study showed eight seizures without clinical signs arising from left fronto-temporal region and three seizures without clinical signs arising from right fronto-temporal region  as described above. Last seizure was noted on 07/14/2023 at 2107.  Additionally there is severe diffuse encephalopathy.  Arlin KIDD Shelton   ECHOCARDIOGRAM COMPLETE Result Date: 07/14/2023    ECHOCARDIOGRAM REPORT   Patient Name:   TRAYQUAN KOLAKOWSKI Date of Exam: 07/14/2023 Medical Rec #:  [id number]         Height:       70.0 in Accession #:    [id number]        Weight:       177.7 lb Date of Birth:  04-Jun-2000  BSA:          1.985 m Patient Age:    22 years          BP:           151/113 mmHg Patient Gender: M                 HR:           116 bpm. Exam Location:  Inpatient Procedure: 2D Echo, Color Doppler and Cardiac Doppler (Both Spectral and Color            Flow Doppler were utilized during procedure). Indications:    Cardiac Arrest  History:        Patient has no prior history of Echocardiogram examinations.                 Polysubstance abuse.  Sonographer:    Logan Shove RDCS Referring Phys: [id number] SUDHAM CHAND IMPRESSIONS  1. Left ventricular ejection fraction, by estimation, is 40 to 45%. The left ventricle has mildly decreased function. Left ventricular endocardial border not optimally defined to evaluate regional wall motion, there is relative septal hypokinesis. Left ventricular diastolic parameters are indeterminate.  2. Right ventricular systolic function is moderately reduced based on limited images. The right ventricular size is normal.  3. The mitral valve is grossly normal. Mild mitral valve regurgitation.  4. The aortic valve is tricuspid. Aortic valve regurgitation is not visualized. No aortic stenosis is present. Aortic valve mean gradient measures 2.0 mmHg. Comparison(s): No prior Echocardiogram. FINDINGS  Left Ventricle: Left ventricular ejection fraction, by  estimation, is 40 to 45%. The left ventricle has mildly decreased function. Left ventricular endocardial border not optimally defined to evaluate regional wall motion. The left ventricular internal cavity size was normal in size. There is no left ventricular hypertrophy. Left ventricular diastolic parameters are indeterminate. Right Ventricle: The right ventricular size is normal. Right vetricular wall thickness was not well visualized. Right ventricular systolic function is moderately reduced. Tricuspid regurgitation signal is inadequate for assessing PA pressure. Left Atrium: Left atrial size was normal in size. Right Atrium: Right atrial size was normal in size. Pericardium: There is no evidence of pericardial effusion. Mitral Valve: The mitral valve is grossly normal. Mild mitral valve regurgitation, with posteriorly-directed jet. Tricuspid Valve: The tricuspid valve is grossly normal. Tricuspid valve regurgitation is trivial. Aortic Valve: The aortic valve is tricuspid. Aortic valve regurgitation is not visualized. No aortic stenosis is present. Aortic valve mean gradient measures 2.0 mmHg. Aortic valve peak gradient measures 3.2 mmHg. Aortic valve area, by VTI measures 2.55 cm. Pulmonic Valve: The pulmonic valve was not well visualized. Pulmonic valve regurgitation is not visualized. Aorta: The aortic root is normal in size and structure. Venous: IVC assessment for right atrial pressure unable to be performed due to mechanical ventilation. IAS/Shunts: The interatrial septum was not well visualized. Additional Comments: 3D was performed not requiring image post processing on an independent workstation and was indeterminate.  LEFT VENTRICLE PLAX 2D LVIDd:         5.30 cm      Diastology LVIDs:         3.30 cm      LV e' medial:    4.03 cm/s LV PW:         0.70 cm      LV E/e' medial:  12.7 LV IVS:        0.70 cm      LV e' lateral:   5.66  cm/s LVOT diam:     2.00 cm      LV E/e' lateral: 9.0 LV SV:         28  LV SV Index:   14 LVOT Area:     3.14 cm  LV Volumes (MOD) LV vol d, MOD A2C: 70.3 ml LV vol d, MOD A4C: 106.0 ml LV vol s, MOD A2C: 42.0 ml LV vol s, MOD A4C: 60.4 ml LV SV MOD A2C:     28.3 ml LV SV MOD A4C:     106.0 ml LV SV MOD BP:      36.6 ml RIGHT VENTRICLE             IVC RV Basal diam:  3.40 cm     IVC diam: 1.30 cm RV S prime:     12.00 cm/s TAPSE (M-mode): 1.9 cm LEFT ATRIUM             Index       RIGHT ATRIUM          Index LA diam:        2.20 cm 1.11 cm/m  RA Area:     9.83 cm LA Vol (A2C):   13.9 ml 7.00 ml/m  RA Volume:   18.00 ml 9.07 ml/m LA Vol (A4C):   10.6 ml 5.34 ml/m LA Biplane Vol: 13.2 ml 6.65 ml/m  AORTIC VALVE AV Area (Vmax):    3.01 cm AV Area (Vmean):   2.97 cm AV Area (VTI):     2.55 cm AV Vmax:           89.40 cm/s AV Vmean:          59.600 cm/s AV VTI:            0.111 m AV Peak Grad:      3.2 mmHg AV Mean Grad:      2.0 mmHg LVOT Vmax:         85.60 cm/s LVOT Vmean:        56.300 cm/s LVOT VTI:          0.090 m LVOT/AV VTI ratio: 0.81  AORTA Ao Root diam: 2.70 cm MITRAL VALVE MV Area (PHT): 3.83 cm    SHUNTS MV Decel Time: 198 msec    Systemic VTI:  0.09 m MV E velocity: 51.20 cm/s  Systemic Diam: 2.00 cm MV A velocity: 55.70 cm/s MV E/A ratio:  0.92 Jayson Sierras MD Electronically signed by Jayson Sierras MD Signature Date/Time: 07/14/2023/5:20:05 PM    Final    EEG adult Result Date: 07/14/2023 Shelton Arlin KIDD, MD     07/14/2023  6:54 AM Patient Name: Steven Hood MRN: [id number] Epilepsy Attending: Arlin KIDD Shelton Referring Physician/Provider: Harold Scholz, MD Date: 07/13/2023 Duration: 22.10 mins Patient history: 23 yo M s/p cardaic arrest. EEG to evaluate for seizure Level of alertness: comatose AEDs during EEG study: None Technical aspects: This EEG study was done with scalp electrodes positioned according to the 10-20 International system of electrode placement. Electrical activity was reviewed with band pass filter of 1-70Hz , sensitivity of 7 uV/mm,  display speed of 76mm/sec with a 60Hz  notched filter applied as appropriate. EEG data were recorded continuously and digitally stored.  Video monitoring was available and reviewed as appropriate. Description: EEG showed continuous generalized background suppression, not reactive to stimulation. Hyperventilation and photic stimulation were not performed.   ABNORMALITY -Background suppression, generalized IMPRESSION: This study is suggestive of profound diffuse encephalopathy. No seizures or epileptiform  discharges were seen throughout the recording. Arlin MALVA Krebs   DG Abd 1 View Result Date: 07/13/2023 CLINICAL DATA:  OG tube placement EXAM: ABDOMEN - 1 VIEW COMPARISON:  None Available. FINDINGS: OG tube coils in the fundus of the stomach. Nonobstructive bowel gas pattern. No free air. IMPRESSION: OG tube in the fundus of the stomach. Electronically Signed   By: Franky Crease M.D.   On: 07/13/2023 16:31   DG CHEST PORT 1 VIEW Result Date: 07/13/2023 CLINICAL DATA:  Intubation, OG tube placement EXAM: PORTABLE CHEST 1 VIEW COMPARISON:  07/13/2023 FINDINGS: Endotracheal tube tip is 5 cm above the carina. OG tube coils in the fundus of the stomach. Heart and mediastinal contours within normal limits. Patchy bilateral airspace disease, increasing since prior study, right greater than left. No pneumothorax. IMPRESSION: Support devices in expected position as above. Worsening bilateral airspace disease, right greater than left. Electronically Signed   By: Franky Crease M.D.   On: 07/13/2023 16:30   CT Cervical Spine Wo Contrast Result Date: 07/13/2023 CLINICAL DATA:  Ataxia, cervical trauma.  Post arrest EXAM: CT CERVICAL SPINE WITHOUT CONTRAST TECHNIQUE: Multidetector CT imaging of the cervical spine was performed without intravenous contrast. Multiplanar CT image reconstructions were also generated. RADIATION DOSE REDUCTION: This exam was performed according to the departmental dose-optimization program which  includes automated exposure control, adjustment of the mA and/or kV according to patient size and/or use of iterative reconstruction technique. COMPARISON:  None Available. FINDINGS: Alignment: Normal Skull base and vertebrae: No acute fracture. No primary bone lesion or focal pathologic process. Soft tissues and spinal canal: No prevertebral fluid or swelling. No visible canal hematoma. Disc levels:  Normal Upper chest: See chest CT report Other: None IMPRESSION: No acute bony abnormality. Electronically Signed   By: Franky Crease M.D.   On: 07/13/2023 15:22   CT CHEST ABDOMEN PELVIS W CONTRAST Result Date: 07/13/2023 CLINICAL DATA:  Polytrauma, blunt.  Post arrest. EXAM: CT CHEST, ABDOMEN, AND PELVIS WITH CONTRAST TECHNIQUE: Multidetector CT imaging of the chest, abdomen and pelvis was performed following the standard protocol during bolus administration of intravenous contrast. RADIATION DOSE REDUCTION: This exam was performed according to the departmental dose-optimization program which includes automated exposure control, adjustment of the mA and/or kV according to patient size and/or use of iterative reconstruction technique. CONTRAST:  75mL OMNIPAQUE  IOHEXOL  350 MG/ML SOLN COMPARISON:  None Available. FINDINGS: CT CHEST FINDINGS Cardiovascular: Heart is normal size. Aorta is normal caliber. Mediastinum/Nodes: No mediastinal, hilar, or axillary adenopathy. Trachea and esophagus are unremarkable. Endotracheal tube tip in the mid trachea. Thyroid unremarkable. Lungs/Pleura: Extensive airspace disease throughout the entire right lung. Patchy airspace disease throughout the left lung. No effusions or pneumothorax. Musculoskeletal: Chest wall soft tissues are unremarkable. No acute bony abnormality. CT ABDOMEN PELVIS FINDINGS Hepatobiliary: No focal hepatic abnormality. Gallbladder unremarkable. Pancreas: No focal abnormality or ductal dilatation. Spleen: No focal abnormality.  Normal size. Adrenals/Urinary Tract:  No adrenal abnormality. No focal renal abnormality. No stones or hydronephrosis. Urinary bladder is unremarkable. Stomach/Bowel: NG tube tip in the stomach. Diffusely fluid-filled small bowel without caliber change to suggest bowel obstruction. Moderate stool burden in the colon. Vascular/Lymphatic: No evidence of aneurysm or adenopathy. Reproductive: No visible focal abnormality. Other: No free fluid or free air. Musculoskeletal: No acute bony abnormality. IMPRESSION: Extensive bilateral airspace disease, right greater than left. This could reflect edema, infection, or hemorrhage. No effusions or pneumothorax. No acute findings in the abdomen or pelvis. Electronically Signed   By: Franky  Dover M.D.   On: 07/13/2023 15:21   CT Head Wo Contrast Result Date: 07/13/2023 CLINICAL DATA:  Ataxia, head trauma, post arrest. EXAM: CT HEAD WITHOUT CONTRAST TECHNIQUE: Contiguous axial images were obtained from the base of the skull through the vertex without intravenous contrast. RADIATION DOSE REDUCTION: This exam was performed according to the departmental dose-optimization program which includes automated exposure control, adjustment of the mA and/or kV according to patient size and/or use of iterative reconstruction technique. COMPARISON:  None Available. FINDINGS: Brain: No acute intracranial abnormality. Specifically, no hemorrhage, hydrocephalus, mass lesion, acute infarction, or significant intracranial injury. Vascular: No hyperdense vessel or unexpected calcification. Skull: No acute calvarial abnormality. Sinuses/Orbits: No acute findings Other: None IMPRESSION: No acute intracranial abnormality. Electronically Signed   By: Franky Crease M.D.   On: 07/13/2023 15:17   DG Abd 1 View Result Date: 07/13/2023 CLINICAL DATA:  Orogastric tube placement.  Post CPR. EXAM: ABDOMEN - 1 VIEW COMPARISON:  Same day chest radiographs. FINDINGS: 1345 hours. Single supine view of the upper abdomen. Tip of the orogastric tube  projects over the left subphrenic area, likely in the gastric fundus. The side hole is near the GE junction. Overlying external pacer leads are present. The visualized bowel gas pattern is unremarkable. IMPRESSION: Orogastric tube tip projects over the gastric fundus with the side hole near the GE junction. Electronically Signed   By: Elsie Perone M.D.   On: 07/13/2023 13:57   DG Chest Port 1 View Result Date: 07/13/2023 CLINICAL DATA:  Trauma patient. Endotracheal tube in place post CPR. EXAM: PORTABLE CHEST 1 VIEW COMPARISON:  Radiographs 05/03/2004. FINDINGS: 1345 hours. Interval somatic growth. Tip of the endotracheal tube is in the mid trachea. Enteric tube projects below the diaphragm, tip not visualized. The heart size and mediastinal contours are normal. The lungs appear clear. No pleural effusion or pneumothorax. No evidence of acute fracture. IMPRESSION: Satisfactory position of support system. No evidence of acute chest injury. Electronically Signed   By: Elsie Perone M.D.   On: 07/13/2023 13:56     Assessment and Plan:  Acute cardiomyopathy status post out-of-hospital cardiac arrest Multiorgan failure  As detailed above in HPI, patient admitted after presenting to the hospital with EMS following out-of-hospital arrest.  In addition to his initial cardiac arrest, patient with 2 subsequent cardiac arrests requiring additional ACLS.  His last cardiac arrest was on day of admission, 7/4.  Patient remains critically ill with no significant neurological recovery to this point.  Pupils are fixed and patient without cough reflex.  On 7/5 patient had an echocardiogram completed that shows a reduced ejection fraction of 40 to 45% with septal wall motion abnormalities.  Today's EKG was noted to have septal/anterior lead ST elevation not seen on his initial EKG.  Given this along with abnormal echocardiogram and elevated troponin, cardiology asked to see patient. Per family at bedside, no  significant family history of heart disease. Difficult situation with critically ill 23 year old patient. Remains on Vasopressin  and Norepinephrine . Although he has troponin elevation to 13,648, in the setting of clear multiorgan failure with rhabdomyolysis and CK greater than 20,000, this elevation is consistent with demand ischemia/acute myocardial injury secondary to global hypoxia. My clinical assessment is that patient's ECG changes are a reflection of multi-organ failure following severe global hypoxia rather than ACS. Low suspicion for ACS in 23 year old patient. Given this, current focus should be on patient's neurological status.  Per primary team notes, there is a plan for brain  MRI tomorrow which will help to dictate overall clinical course and management.  If patient has significant neurological recovery as well as recovery from multi-organ failure, could consider ischemic evaluation.  Per primary team: Acute hypoxic respiratory failure with severe aspiration and ARDS Seizures Shock liver/AKI/Rhabdomyolysis  Risk Assessment/Risk Scores:    TIMI Risk Score for Unstable Angina or Non-ST Elevation MI:   The patient's TIMI risk score is 1, which indicates a 5% risk of all cause mortality, new or recurrent myocardial infarction or need for urgent revascularization in the next 14 days.   CRITICAL CARE TIME: I have spent a total of 35 minutes with patient reviewing hospital notes, telemetry, EKGs, labs and examining the patient as well as establishing an assessment and plan that was discussed with the patient's family.  > 50% of time was spent in direct patient care. The patient is critically ill with multi-organ system failure and requires high complexity decision making for assessment and support, frequent evaluation and titration of therapies, application of advanced monitoring technologies and extensive interpretation of multiple databases.    For questions or updates, please contact [facility] Please consult [email or web address] for contact info under    Signed, Artist Pouch, PA-C  07/15/2023 1:51 PM  Patient seen and examined.  Agree with below documentation.  Mr. Casasola is a 23 year old male with a history of asthma who are consulted for evaluation of abnormal EKG and elevated troponin at the request of Steven Lesches, NP.  Patient was found down in his home.  Per report, his family had seen him 30 minutes prior to him being found unresponsive.  Initial rhythm per EMS was asystole.  He received 12 minutes of ACLS before ROSC was obtained.  He again lost pulses in the ambulance and ACLS was resumed.  ROSC was obtained.  In the ED, he again was found to be pulseless and underwent a third round of ACLS before obtained ROSC.  Labs notable for pH 6.8.  Creatinine up to 2.29, CK greater than 20,000, elevated LFTs, troponin 13,000.  Echocardiogram yesterday showed EF 40 to 45%, moderately reduced RV function.  EKG today showed normal sinus rhythm, rate 95, 1 mm concave ST elevation in V1-3.  On exam, patient is intubated, unresponsive, regular rate and rhythm, no murmur, mechanical breath sounds, no edema.  For his acute systolic heart failure and elevated troponin and abnormal EKG, suspect due to his multiorgan failure in setting of cardiac arrest from suspected overdose.  Would have a low suspicion for acute coronary syndrome given his age.  Suspect troponin elevation is related to his rhabdomyolysis, with CK being markedly elevated.  No room to add GDMT as currently in shock requiring multiple pressors.  No further cardiac workup recommended at this time.  Lonni LITTIE Nanas, MD

## 2023-07-15 NOTE — Consult Note (Addendum)
 Palliative Medicine Inpatient Consult Note  Consulting Provider: Dr. Kara  Reason for consult:  Goals of care  07/15/2023  HPI:  Per intake H&P --> Steven Hood is a 23 year old male with no reported past medical history who presented to the ED via EMS after being found unresponsive by family, concern for substance abuse/unintentional overdose given drug paraphernalia found per EMS.  Palliative care has been asked to support goals of care conversations as Steven Hood suffered multiple cardiac arrests.  Clinical Assessment/Goals of Care:  *Please note that this is a verbal dictation therefore any spelling or grammatical errors are due to the Dragon Medical One system interpretation.  I have reviewed medical records including EPIC notes, labs and imaging, received report from bedside RN, assessed the patient who is lying in the ICU bed intubated - critically ill.    I met with Steven Hood's - father at bedside to further discuss diagnosis prognosis, GOC, EOL wishes, disposition and options.   I introduced Palliative Medicine as specialized medical care for people living with serious illness. It focuses on providing relief from the symptoms and stress of a serious illness. The goal is to improve quality of life for both the patient and the family.  Medical History Review and Understanding:  Steven Hood has no appreciable past medical history.   Social History:  Steven Hood lives in [place], [place] . He is not married. He has three children ages 56, 2, and 1. He formerly worked in Research [employer], trade [employer]. He has three sisters with whom he has a close relationship. He is a man of Christian Faith  Functional and Nutritional State:  Prior to hospitalization, Steven Hood was able to perform all iADL's and bADL's.   Advance Directives:  A detailed discussion was had today regarding advanced directives.  Patient has no advanced directives on file.   Code Status:  Concepts specific to code  status, artifical feeding and hydration, continued IV antibiotics and rehospitalization was had.  The difference between a aggressive medical intervention path  and a palliative comfort care path for this patient at this time was had.   Patients parents would like for Steven Hood to receive all measures of care. They do not want to set limitations on care.    Discussion:  We discussed that [name] has suffered multiple cardiac arrests. We reviewed the concern associated with Gentle's downtime which was 12 minutes. We reviewed that he has always been the life of the Party and has a strong spirit. We discussed that his situation is likely going to be quite different as often - there are changes in cognition after long arrests. Most notably patients can experience brain injury as a result of this which changes they way they act, mobilize, and perform daily activities.   Patients father notes that he has great hope that [name] will recover. He shares that he understands this will be a long and difficult road but he is very hopeful that he can come through this for his children.  _______________________  A meeting was held later in the morning in the company of patients father and mother.  Steven Hood, APP cor CCM, Steven Bihari, RN, and myself were present.  Steven shares that [name] has endured great neurological burden in the setting of his resuscitation event - he remains to have little to no response. The plan at this time will be for an MRI tomorrow to further assess the extent of anoxic brain injury. We reviewed in addition that patient has begun to seize and  is being treated for this.   Steven Hood shares Steven Hood has had tremendous injury to his heart and it is unlikely to be due to obstruction, but more likely to be in the setting of the drugs in his system.   It is shared in addition to the above, [name] has endured significant shock to his liver. He also has kidney dysfunction due to  rhabdomyolysis.   Patients parents do seem to understand the significance of his disease burden and request some time to digest the information provided.   Discussed the importance of continued conversation with family and their  medical providers regarding overall plan of care and treatment options, ensuring decisions are within the context of the patients values and GOCs.  Decision Maker: Steven Hood (Mother): [phone] (Mobile)   SUMMARY OF RECOMMENDATIONS   Full Code / Full scope of care  Continue to allow time for outcomes  CCM has explained in detail the significance of patients illness(s) to his parents  Ongoing PMT support as needed - family want to continue aggressive measures  Code Status/Advance Care Planning: FULL CODE  Palliative Prophylaxis:  Aspiration, Bowel Regimen, Delirium Protocol, Frequent Pain Assessment, Oral Care, Palliative Wound Care, and Turn Reposition  Additional Recommendations (Limitations, Scope, Preferences): Full Scope Treatment  Psycho-social/Spiritual:  Desire for further Chaplaincy support: Yes - Christian Additional Recommendations: Education MSOD and long term trajectory   Prognosis: Limited overall.   Discharge Planning: Discharge plan is indeterminate at this time.   Vitals:   07/15/23 0500 07/15/23 0600  BP: (!) 119/91 112/85  Pulse: 98 (!) 102  Resp: (!) 30 (!) 30  Temp: 98.8 F (37.1 C)   SpO2: 97%     Intake/Output Summary (Last 24 hours) at 07/15/2023 0708 Last data filed at 07/15/2023 0600 Gross per 24 hour  Intake 5360.75 ml  Output 2135 ml  Net 3225.75 ml   Last Weight  Most recent update: 07/15/2023  4:37 AM    Weight  80.6 kg (177 lb 11.1 oz)            Gen:  Young AA M critically ill in appearance HEENT: ETT, OGT, dry mucous membranes CV: Regular rate and rhythm  PULM:  On mechanical ventilator ABD: soft, nontender EXT: No edema  Neuro: NO response  PPS: 10%   This conversation/these  recommendations were discussed with patient primary care team, Dr. Kara and Steven ______________________________________________________ [name] [facility] Palliative Medicine Team Team Cell Phone: [phone] Please utilize secure chat with additional questions, if there is no response within 30 minutes please call the above phone number  Time: 4 Billing based on MDM: High  Palliative Medicine Team providers are available by phone from 7am to 7pm daily and can be reached through the team cell phone.  Should this patient require assistance outside of these hours, please call the patient's attending physician.

## 2023-07-15 NOTE — Progress Notes (Signed)
 NAME:  Steven Hood, MRN:  [id number], DOB:  09-24-2000, LOS: 2 ADMISSION DATE:  07/13/2023, CONSULTATION DATE: 07/13/2023 REFERRING MD: Dr. Mannie - EDP, CHIEF COMPLAINT: Cardiac arrest  History of Present Illness:  Steven Hood is a 23 year old male with no reported past medical history who presented to the ED via EMS after being found unresponsive by family, concern for substance abuse/unintentional overdose given drug paraphernalia found per EMS.  On EMS arrival patient was found asystolic prompting immediate ACLS patient required 12 minutes of CLS protocol prior to ROSC did lose pulses again and around requiring 1 more round of ACLS before acquiring ROSC again.  On ED arrival patient was seen hypothermic with temperature 86.9 F and hypotensive.  Jorde of blood work pending at time of arrival but i-STAT chemistry reveals potassium 6.0 with creatinine 2.0.  Arterial blood gas with pH 7.0, pCO2 53, PO2 460, bicarb 16.6.  PCCM consulted for further management and admission.  Pertinent  Medical History  None  Significant Hospital Events: Including procedures, antibiotic start and stop dates in addition to other pertinent events   7/4 found unresponsive in asystole with high suspicion for drug overdose required 12 minutes of ACLS 7/5 evolving hypoxic respiratory failure in the setting of ARDS from aspiration pneumonia now proned  7/6 afternoon P/F ratio improved allowing for patient to remain supinated however continuous EEG did confirm 8 seizures without clinical signs prompting load of Keppra   Interim History / Subjective:  Unresponsive on ventilator  Objective    Blood pressure 119/86, pulse (!) 103, temperature 99 F (37.2 C), resp. rate (!) 30, height 5' 10 (1.778 m), weight 80.6 kg, SpO2 99%.    Vent Mode: PRVC FiO2 (%):  [97 %-100 %] 100 % Set Rate:  [30 bmp] 30 bmp Vt Set:  [440 mL] 440 mL PEEP:  [12 cmH20] 12 cmH20 Plateau Pressure:  [26 cmH20-27 cmH20] 26 cmH20    Intake/Output Summary (Last 24 hours) at 07/15/2023 9192 Last data filed at 07/15/2023 0800 Gross per 24 hour  Intake 5527.8 ml  Output 1895 ml  Net 3632.8 ml   Filed Weights   07/13/23 1600 07/14/23 0500 07/15/23 0434  Weight: 80.6 kg 80.6 kg 80.6 kg    Examination: General: Acute ill-appearing adult male lying in bed on mechanical ventilation in no acute distress HEENT: ETT, MM pink/moist, PERRL,  Neuro: Unresponsive CV: s1s2 regular rate and rhythm, no murmur, rubs, or gallops,  PULM: Clear to auscultation bilaterally, no increased work of breathing, tolerating ventilator GI: soft, bowel sounds active in all 4 quadrants, non-tender, non-distended Extremities: warm/dry, no edema  Skin: no rashes or lesions  Resolved problem list   Assessment and Plan  Out-of-hospital cardiac arrest - Original rhythm was asystole per EMS.  Suffered third arrest on my evaluation in ED.  He became progressively bradycardic with loss of pleth of A-line and on pulse check patient was pulseless round of ACLS Circulatory shock - Currently requiring 3 vasopressor supports New onset HFrEF - Echocardiogram 9/5 with EF of 40 to 45% with septal hypokinesis Elevated troponin -Likely reactive from multiple cardiac arrest and potential vasospasm in the setting of cocaine, troponin greater than 20,000 P: Goal of normothermia, cooling pads remain in place continuous telemetry Continue vasopressor support for MAP goal greater than 65 Strict intake and output Daily weight Daily assessment for need to diurese  Acute hypoxic and hypercapnic respiratory failure secondary severe aspiration with development of ARDS  - P/F ratio had increased to  261 afternoon 7/5 therefore patient was left a supinated.  P/F ratio slightly decreased again this a.m. to 187 P: Continue to follow ABGs to determine need for reproning  Continue bowel Continue ventilator support with lung protective strategies  Wean PEEP and FiO2 for  sats greater than 90%. Head of bed elevated 30 degrees. Plateau pressures less than 30 cm H20.  Follow intermittent chest x-ray and ABG.   SAT/SBT as tolerated, mentation preclude extubation  Ensure adequate pulmonary hygiene  Follow cultures  VAP bundle in place  PAD protocol  At risk for anoxic encephalopathy. -Estimated 12 minutes of downtime -EEG and head CT negative  New-onset seizures -Continuous EEG applied 7/5 on observation this a.m. multiple nonepileptic seizures were seen  P: Neurology following, greatly appreciate assistance Keppra  load overnight Maintain neuroprotective measures Nutrition and bowel regiment Seizure precautions Aspiration precautions MRI at 72 hours postarrest  Shock liver  - LFTs drastically elevated on the a.m. labs indicating likely shock liver during cardiac arrest P: Avoid hepatotoxins Continue supportive care  Acute kidney injury - Creatinine bumped to 2.29 with GFR 40 a.m. 7/5 Rhabdomyolysis -CK greater than 20,000 7/5 P: Follow renal function Monitor urine output Continue IV hydration Avoid nephrotoxins Ensure adequate renal perfusion  Repeat CK  Substance abuse - Girlfriend reports that they were drinking alcohol night prior to admission and the patient used to use Percocets but she is unsure if he has used recently.  She did find some unknown pills behind bed morning of admission - UDS positive for cocaine and THC P: Monitor for signs of withdrawal  Hyponatremia Severe hypophosphatemia Hypomagnesemia P: Supplement potassium and phosphate  Best Practice (right click and Reselect all SmartList Selections daily)   Diet/type: NPO DVT prophylaxis SCD Pressure ulcer(s): N/A GI prophylaxis: PPI Lines: Central line and Arterial Line Foley:  Yes, and it is still needed Code Status:  full code Last date of multidisciplinary goals of care discussion: Confirmed with patient's mother he is a full code with all aggressive  interventions available  Critical care time:   CRITICAL CARE Performed by: Ean Gettel D. Harris   Total critical care time: 40 minutes  Critical care time was exclusive of separately billable procedures and treating other patients.  Critical care was necessary to treat or prevent imminent or life-threatening deterioration.  Critical care was time spent personally by me on the following activities: development of treatment plan with patient and/or surrogate as well as nursing, discussions with consultants, evaluation of patient's response to treatment, examination of patient, obtaining history from patient or surrogate, ordering and performing treatments and interventions, ordering and review of laboratory studies, ordering and review of radiographic studies, pulse oximetry and re-evaluation of patient's condition.  Emagene Merfeld D. Harris, NP-C [facility] Personal contact information can be found on Amion  If no contact or response made please call [phone] 07/15/2023, 8:07 AM

## 2023-07-16 ENCOUNTER — Inpatient Hospital Stay (HOSPITAL_COMMUNITY)

## 2023-07-16 DIAGNOSIS — M6282 Rhabdomyolysis: Secondary | ICD-10-CM

## 2023-07-16 DIAGNOSIS — G9382 Brain death: Secondary | ICD-10-CM | POA: Diagnosis not present

## 2023-07-16 DIAGNOSIS — R7401 Elevation of levels of liver transaminase levels: Secondary | ICD-10-CM

## 2023-07-16 DIAGNOSIS — R569 Unspecified convulsions: Secondary | ICD-10-CM | POA: Diagnosis not present

## 2023-07-16 DIAGNOSIS — I469 Cardiac arrest, cause unspecified: Secondary | ICD-10-CM | POA: Diagnosis not present

## 2023-07-16 DIAGNOSIS — J9602 Acute respiratory failure with hypercapnia: Secondary | ICD-10-CM | POA: Diagnosis not present

## 2023-07-16 DIAGNOSIS — J9601 Acute respiratory failure with hypoxia: Secondary | ICD-10-CM | POA: Diagnosis not present

## 2023-07-16 DIAGNOSIS — I5021 Acute systolic (congestive) heart failure: Secondary | ICD-10-CM

## 2023-07-16 DIAGNOSIS — J690 Pneumonitis due to inhalation of food and vomit: Secondary | ICD-10-CM

## 2023-07-16 LAB — POCT I-STAT 7, (LYTES, BLD GAS, ICA,H+H)
Acid-Base Excess: 3 mmol/L — ABNORMAL HIGH (ref 0.0–2.0)
Acid-Base Excess: 4 mmol/L — ABNORMAL HIGH (ref 0.0–2.0)
Bicarbonate: 27 mmol/L (ref 20.0–28.0)
Bicarbonate: 32.6 mmol/L — ABNORMAL HIGH (ref 20.0–28.0)
Calcium, Ion: 1.03 mmol/L — ABNORMAL LOW (ref 1.15–1.40)
Calcium, Ion: 1.16 mmol/L (ref 1.15–1.40)
HCT: 30 % — ABNORMAL LOW (ref 39.0–52.0)
HCT: 33 % — ABNORMAL LOW (ref 39.0–52.0)
Hemoglobin: 10.2 g/dL — ABNORMAL LOW (ref 13.0–17.0)
Hemoglobin: 11.2 g/dL — ABNORMAL LOW (ref 13.0–17.0)
O2 Saturation: 100 %
O2 Saturation: 99 %
Potassium: 3.2 mmol/L — ABNORMAL LOW (ref 3.5–5.1)
Potassium: 3.3 mmol/L — ABNORMAL LOW (ref 3.5–5.1)
Sodium: 134 mmol/L — ABNORMAL LOW (ref 135–145)
Sodium: 134 mmol/L — ABNORMAL LOW (ref 135–145)
TCO2: 28 mmol/L (ref 22–32)
TCO2: 35 mmol/L — ABNORMAL HIGH (ref 22–32)
pCO2 arterial: 34.6 mmHg (ref 32–48)
pCO2 arterial: 81 mmHg (ref 32–48)
pH, Arterial: 7.212 — ABNORMAL LOW (ref 7.35–7.45)
pH, Arterial: 7.5 — ABNORMAL HIGH (ref 7.35–7.45)
pO2, Arterial: 181 mmHg — ABNORMAL HIGH (ref 83–108)
pO2, Arterial: 296 mmHg — ABNORMAL HIGH (ref 83–108)

## 2023-07-16 LAB — CBC
HCT: 33 % — ABNORMAL LOW (ref 39.0–52.0)
Hemoglobin: 11.2 g/dL — ABNORMAL LOW (ref 13.0–17.0)
MCH: 29.4 pg (ref 26.0–34.0)
MCHC: 33.9 g/dL (ref 30.0–36.0)
MCV: 86.6 fL (ref 80.0–100.0)
Platelets: 129 K/uL — ABNORMAL LOW (ref 150–400)
RBC: 3.81 MIL/uL — ABNORMAL LOW (ref 4.22–5.81)
RDW: 13.1 % (ref 11.5–15.5)
WBC: 17 K/uL — ABNORMAL HIGH (ref 4.0–10.5)
nRBC: 0.3 % — ABNORMAL HIGH (ref 0.0–0.2)

## 2023-07-16 LAB — COMPREHENSIVE METABOLIC PANEL WITH GFR
ALT: 1108 U/L — ABNORMAL HIGH (ref 0–44)
AST: 741 U/L — ABNORMAL HIGH (ref 15–41)
Albumin: 2.2 g/dL — ABNORMAL LOW (ref 3.5–5.0)
Alkaline Phosphatase: 71 U/L (ref 38–126)
Anion gap: 11 (ref 5–15)
BUN: 27 mg/dL — ABNORMAL HIGH (ref 6–20)
CO2: 24 mmol/L (ref 22–32)
Calcium: 7.8 mg/dL — ABNORMAL LOW (ref 8.9–10.3)
Chloride: 97 mmol/L — ABNORMAL LOW (ref 98–111)
Creatinine, Ser: 1.65 mg/dL — ABNORMAL HIGH (ref 0.61–1.24)
GFR, Estimated: 60 mL/min — ABNORMAL LOW (ref >60)
Glucose, Bld: 139 mg/dL — ABNORMAL HIGH (ref 70–99)
Potassium: 3.5 mmol/L (ref 3.5–5.1)
Sodium: 132 mmol/L — ABNORMAL LOW (ref 135–145)
Total Bilirubin: 1.8 mg/dL — ABNORMAL HIGH (ref 0.0–1.2)
Total Protein: 5.2 g/dL — ABNORMAL LOW (ref 6.5–8.1)

## 2023-07-16 LAB — GLUCOSE, CAPILLARY
Glucose-Capillary: 113 mg/dL — ABNORMAL HIGH (ref 70–99)
Glucose-Capillary: 122 mg/dL — ABNORMAL HIGH (ref 70–99)
Glucose-Capillary: 128 mg/dL — ABNORMAL HIGH (ref 70–99)

## 2023-07-16 LAB — PROTIME-INR
INR: 1.1 (ref 0.8–1.2)
Prothrombin Time: 14.7 s (ref 11.4–15.2)

## 2023-07-16 LAB — PHOSPHORUS: Phosphorus: 3.8 mg/dL (ref 2.5–4.6)

## 2023-07-16 LAB — MAGNESIUM: Magnesium: 2.2 mg/dL (ref 1.7–2.4)

## 2023-07-16 MED ORDER — LACTATED RINGERS IV BOLUS: 1000.0000 mL | Freq: Once | INTRAVENOUS | Status: DC

## 2023-07-16 MED ORDER — PHENYLEPHRINE 80 MCG/ML (10ML) SYRINGE FOR IV PUSH (FOR BLOOD PRESSURE SUPPORT)
PREFILLED_SYRINGE | INTRAVENOUS | Status: AC
  Filled 2023-07-16: qty 10

## 2023-07-17 ENCOUNTER — Encounter (HOSPITAL_COMMUNITY): Payer: Self-pay

## 2023-07-18 LAB — URINE DRUGS OF ABUSE SCREEN W ALC, ROUTINE (REF LAB)
Amphetamines, Urine: NEGATIVE ng/mL
Barbiturate, Ur: NEGATIVE ng/mL
Benzodiazepine Quant, Ur: NEGATIVE ng/mL
Cocaine (Metab.): NEGATIVE ng/mL
Creatinine, Urine: 7 mg/dL — ABNORMAL LOW (ref 20.0–300.0)
Ethanol U, Quan: NEGATIVE %
Nitrite Urine, Quantitative: NEGATIVE ug/mL
OPIATE SCREEN URINE: NEGATIVE ng/mL
Phencyclidine, Ur: NEGATIVE ng/mL
Propoxyphene, Urine: NEGATIVE ng/mL
pH, Urine: 6 (ref 4.5–8.9)

## 2023-07-24 LAB — MISC LABCORP TEST (SEND OUT): Labcorp test code: 703025

## 2023-07-27 LAB — DRUG SCREEN 10 W/CONF, SERUM
Amphetamines, IA: NEGATIVE ng/mL
Barbiturates, IA: NEGATIVE ug/mL
Benzodiazepines, IA: NEGATIVE ng/mL
Cocaine & Metabolite, IA: NEGATIVE ng/mL
Methadone, IA: POSITIVE ng/mL — AB
Opiates, IA: NEGATIVE ng/mL
Oxycodones, IA: NEGATIVE ng/mL
Phencyclidine, IA: NEGATIVE ng/mL
Propoxyphene, IA: NEGATIVE ng/mL
THC(Marijuana) Metabolite, IA: POSITIVE ng/mL — AB

## 2023-08-10 NOTE — Progress Notes (Signed)
 TOD declared this morning, see prior documentation   Remained intubated to facilitate grandparents arrival. They have seen the patient.  We will proceed with extubation, discussed with parents at bedside   Is an ME case, will reach out to ME again post extubation when cardiac death has also occurred     Ronnald Gave MSN, AGACNP-BC [facility] 06-Aug-2023, 3:51 PM

## 2023-08-10 NOTE — Progress Notes (Signed)
 I was at bedside during brain death testing, performed by neurology.  Patient was declared dead 08/02/2023 at 10:26  Family was present and is informed.    The patient was reconnected to the ventilator and family is grieving at the bedside. We will revisit timing of extubation in the coming hour(s) -- just allowing a period of grieving as folks arrive. I informed the pts father that the patient's code status will be updated to DNR and a purple bracelet placed, he understands.    Add'l CCT 28  min     Ronnald Gave MSN, AGACNP-BC [facility] Pulmonary/Critical Care Medicine 2023/08/02, 11:07 AM

## 2023-08-10 NOTE — Procedures (Signed)
 Extubation Procedure Note  Patient Details:   Name: Steven Hood DOB: July 17, 2000 MRN: [id number]   Airway Documentation:    Vent end date: 08-Aug-2023 Vent end time: 1615    Pt extubated to comfort care with RN and family at bedside per MD order.  Margarie CHRISTELLA Breen 2023-08-08, 4:20 PM

## 2023-08-10 NOTE — Plan of Care (Signed)
   Palliative Medicine Inpatient Follow Up Note  The Palliative care team remains peripherally involved with Steven Hood.   Patient declared brain dead at 10:26 per CCM.   Patient transitioned to a DNR.  No Charge.  *If additional support is needed PMT will remain available. ______________________________________________________________________________________ [name] [facility] Palliative Medicine Team Team Cell Phone: [phone] Please utilize secure chat with additional questions, if there is no response within 30 minutes please call the above phone number  Palliative Medicine Team providers are available by phone from 7am to 7pm daily and can be reached through the team cell phone.  Should this patient require assistance outside of these hours, please call the patient's attending physician.

## 2023-08-10 NOTE — Procedures (Addendum)
 Patient Name: VANNA SHAVERS  MRN: [id number]  Epilepsy Attending: Arlin MALVA Krebs  Referring Physician/Provider: Arloa Folks D, NP  Duration: 07/15/2023 1439 to 07-28-2023 1138   Patient history:  23 yo M s/p cardaic arrest. EEG to evaluate for seizure   Level of alertness: comatose   AEDs during EEG study: LEV   Technical aspects: This EEG study was done with scalp electrodes positioned according to the 10-20 International system of electrode placement. Electrical activity was reviewed with band pass filter of 1-70Hz , sensitivity of 7 uV/mm, display speed of 18mm/sec with a 60Hz  notched filter applied as appropriate. EEG data were recorded continuously and digitally stored.  Video monitoring was available and reviewed as appropriate.   Description: EEG initially showed continuous generalized 3-6hz  theta-delta slowing.  At around 1600 on 07/15/2023, EEG worsened and showed continuous generalized background suppression, not reactive to stimulation.  Hyperventilation and photic stimulation were not performed.      ABNORMALITY - Continuous slow, generalized - Background suppression, generalized   IMPRESSION: This study was initially suggestive of severe diffuse encephalopathy.  After around 1600 on 07/15/2023, EEG worsened and was suggestive of profound diffuse encephalopathy.  No seizures were noted.   Lorelai Huyser O Mirely Pangle

## 2023-08-10 NOTE — Progress Notes (Signed)
 Patient's family in the middle of goodbyes. Tech did not feel like it was appropriate to intrude at this time. Will return on the hour. Unit Secretary will call should family step out.

## 2023-08-10 NOTE — Progress Notes (Signed)
 NEUROLOGY CONSULT FOLLOW UP NOTE   Date of service: 07-24-23 Patient Name: Steven Hood MRN:  [id number] DOB:  16-Aug-2000  Interval Hx/subjective  - Pupillary reflexes lost overnight - EEG demonstrates loss of cortical activity  Vitals   Vitals:   2023/07/24 0645 07/24/2023 0700 07/24/23 0715 07/24/2023 0727  BP:      Pulse: 86 88 83   Resp: (!) 24 10 12    Temp: (!) 97.5 F (36.4 C) 97.7 F (36.5 C) 97.9 F (36.6 C)   TempSrc:      SpO2: 100% 99% 98% 98%  Weight:      Height:         Body mass index is 25.5 kg/m.  Physical Exam   Constitutional: Appears well-developed and well-nourished. Respiratory: Not breathing over the ventilator   Neurologic Examination    MS: Does not open eyes or follow commands CN: Pupils are fixed and dilated, corneals are absent, no response to doll's maneuver or cold calorics, no cough, no gag Sensory/Motor: No response to noxious stimulation distally or proximally including no changes in HR    Medications  Current Facility-Administered Medications:    artificial tears (LACRILUBE) ophthalmic ointment, , Both Eyes, Q4H PRN, Kara Dorn NOVAK, MD, 1 Application at 07/15/23 1732   Chlorhexidine  Gluconate Cloth 2 % PADS 6 each, 6 each, Topical, Daily, Harold Scholz, MD, 6 each at 07/15/23 1200   docusate (COLACE) 50 MG/5ML liquid 100 mg, 100 mg, Per Tube, BID PRN, Millen, Jessica B, RPH   enoxaparin  (LOVENOX ) injection 40 mg, 40 mg, Subcutaneous, Q24H, Chand, Sudham, MD, 40 mg at 07/15/23 1354   EPINEPHrine  (ADRENALIN ) 5 mg in NS 250 mL (0.02 mg/mL) premix infusion, 0.5-20 mcg/min, Intravenous, Titrated, Harold Scholz, MD, Stopped at 07/14/23 2227   epoprostenol  (VELETRI ) for inhalation 1.5mg /64mL (30,000 ng/mL), 50 ng/kg/min (Ideal), Inhalation, Continuous, Harris, Whitney D, NP, Last Rate: 7.3 mL/hr at July 24, 2023 0608, 50 ng/kg/min at 07-24-2023 0608   famotidine  (PEPCID ) tablet 20 mg, 20 mg, Per Tube, Daily, Chand, Sudham, MD, 20 mg at  07/15/23 0950   feeding supplement (PROSource TF20) liquid 60 mL, 60 mL, Per Tube, Daily, Harris, Whitney D, NP, 60 mL at 07/15/23 1352   feeding supplement (VITAL HIGH PROTEIN) liquid 1,000 mL, 1,000 mL, Per Tube, Q24H, Harris, Whitney D, NP, Infusion Verify at 07/15/23 2000   fentaNYL  (SUBLIMAZE ) injection 50-200 mcg, 50-200 mcg, Intravenous, Q30 min PRN, Harold Scholz, MD, 50 mcg at 07/14/23 0426   hydrocortisone  sodium succinate  (SOLU-CORTEF ) 100 MG injection 100 mg, 100 mg, Intravenous, Q12H, Chand, Sudham, MD, 100 mg at 07/15/23 1822   insulin  aspart (novoLOG ) injection 0-15 Units, 0-15 Units, Subcutaneous, Q4H, Paliwal, Aditya, MD, 2 Units at 07-24-23 0051   levETIRAcetam  (KEPPRA ) undiluted injection 500 mg, 500 mg, Intravenous, BID, Kara Dorn B, MD, 500 mg at 07/24/2023 0004   linezolid  (ZYVOX ) IVPB 600 mg, 600 mg, Intravenous, Q12H, Kara Dorn B, MD, Last Rate: 300 mL/hr at 07/24/2023 0531, 600 mg at 07-24-2023 0531   mupirocin  ointment (BACTROBAN ) 2 % 1 Application, 1 Application, Nasal, BID, Chand, Sudham, MD, 1 Application at 07/15/23 0949   norepinephrine  (LEVOPHED ) 16 mg in (0.064 mg/mL) premix infusion, 0-40 mcg/min, Intravenous, Titrated, Kara Dorn B, MD, Last Rate: 8.44 mL/hr at 07-24-2023 0048, 9 mcg/min at 07/24/23 0048   ondansetron  (ZOFRAN ) injection 4 mg, 4 mg, Intravenous, Q6H PRN, Harold Scholz, MD   Oral care mouth rinse, 15 mL, Mouth Rinse, Q2H, Chand, Sudham, MD, 15 mL at  2023/08/15 0544   Oral care mouth rinse, 15 mL, Mouth Rinse, PRN, Chand, Sudham, MD   piperacillin -tazobactam (ZOSYN ) IVPB 3.375 g, 3.375 g, Intravenous, Q8H, Kara Carrier B, MD, Last Rate: 12.5 mL/hr at 08/15/23 0533, 3.375 g at 08/15/2023 0533   polyethylene glycol (MIRALAX  / GLYCOLAX ) packet 17 g, 17 g, Per Tube, Daily PRN, Harold Scholz, MD   vasopressin  (PITRESSIN) 20 Units in 100 mL (0.2 unit/mL) infusion-*FOR SHOCK*, 0-0.04 Units/min, Intravenous, Continuous, Harris, Whitney D, NP,  Last Rate: 9 mL/hr at 08-15-23 0440, 0.03 Units/min at Aug 15, 2023 0440  Labs and Diagnostic Imaging    EEG without evidence of cortical activity on my bedside review This study was initially suggestive of severe diffuse encephalopathy.  After around 1600 on 07/15/2023, EEG worsened with suggestive of profound diffuse encephalopathy.  No seizures were noted  Imaging(Personally reviewed): Initial CT head, I suspect there is some indistinct findings in the thalami  Assessment   Steven Hood is a 23 y.o. male with anoxic brain injury.  Exam now consistent with brain death, form updated in chart. EEG (in place for seizure control, per guidelines not being used as a confirmatory test.   Recommendations  - Brain death testing completed, family notified of patient's passing  ______________________________________________________________________  This patient is critically ill and at significant risk of neurological worsening, death and care requires constant monitoring of vital signs, hemodynamics,respiratory and cardiac monitoring, neurological assessment, discussion with family, other specialists and medical decision making of high complexity. I spent 70 minutes of neurocritical care time  in the care of  this patient. This was time spent independent of any time provided by nurse practitioner or PA.  Lola Jernigan MD-PhD [facility] [phone]   If 7pm- 7am, please page neurology on call as listed in AMION. 2023/08/15  7:41 AM

## 2023-08-10 NOTE — Progress Notes (Signed)
 Heart Failure Navigator Progress Note  Assessed for Heart & Vascular TOC clinic readiness.  Patient does not meet criteria due to transitioning to comfort care. No HF TOC. .   Navigator will sign off at this time.   Rhae Hammock, BSN, Scientist, clinical (histocompatibility and immunogenetics) Only

## 2023-08-10 NOTE — Progress Notes (Addendum)
 NAME:  Steven Hood, MRN:  [id number], DOB:  January 25, 2000, LOS: 3 ADMISSION DATE:  07/13/2023, CONSULTATION DATE: 07/13/2023 REFERRING MD: Dr. Mannie - EDP, CHIEF COMPLAINT: Cardiac arrest  History of Present Illness:  Steven Hood is a 23 year old male with no reported past medical history who presented to the ED via EMS after being found unresponsive by family, concern for substance abuse/unintentional overdose given drug paraphernalia found per EMS.  On EMS arrival patient was found asystolic prompting immediate ACLS patient required 12 minutes of CLS protocol prior to ROSC did lose pulses again and around requiring 1 more round of ACLS before acquiring ROSC again.  On ED arrival patient was seen hypothermic with temperature 86.9 F and hypotensive.  Jorde of blood work pending at time of arrival but i-STAT chemistry reveals potassium 6.0 with creatinine 2.0.  Arterial blood gas with pH 7.0, pCO2 53, PO2 460, bicarb 16.6.  PCCM consulted for further management and admission.  Pertinent  Medical History  None  Significant Hospital Events: Including procedures, antibiotic start and stop dates in addition to other pertinent events   7/4 found unresponsive in asystole with high suspicion for drug overdose required 12 minutes of ACLS 7/5 evolving hypoxic respiratory failure in the setting of ARDS from aspiration pneumonia now proned  7/6 afternoon P/F ratio improved allowing for patient to remain supinated however continuous EEG did confirm 8 seizures without clinical signs prompting load of Keppra  7/7 concern for brain death   Interim History / Subjective:  Pupil change overnight   Labs this morning  Na 132 BUN 67 Cr 1.65 serum bicarb 24  AST 741 ALT 1108  WBC 17  Hgb 11 plt 129   MRSA PCR + Trach asp with multiple organisms, possibly just normal flora   Objective    Blood pressure (!) 143/96, pulse 89, temperature 97.9 F (36.6 C), resp. rate (!) 24, height 5' 10 (1.778 m),  weight 80.6 kg, SpO2 99%.    Vent Mode: PRVC FiO2 (%):  [60 %-100 %] 60 % Set Rate:  [24 bmp-28 bmp] 24 bmp Vt Set:  [440 mL] 440 mL PEEP:  [10 cmH20-12 cmH20] 10 cmH20 Plateau Pressure:  [24 cmH20-29 cmH20] 24 cmH20   Intake/Output Summary (Last 24 hours) at 27-Jul-2023 0854 Last data filed at 27-Jul-2023 0800 Gross per 24 hour  Intake 3192.71 ml  Output 1190 ml  Net 2002.71 ml   Filed Weights   07/13/23 1600 07/14/23 0500 07/15/23 0434  Weight: 80.6 kg 80.6 kg 80.6 kg    Examination: General: Critically ill young adult M  Neuro: Pupils are fixed and dilated. No cough or gag. No corneal reflex. Absent dolls eye. Does not trigger vent on PSV. No response to pain  HEENT: EEG in place. ETT secure OGT secure  CV: rrr cap refill < 3 sec  PULM: Symmetrical chest expansion, mechanically ventilated  GI: soft non distended  Extremities: no obvious acute joint deformity  Skin: clean dry. Scattered tattoos   Resolved problem list   Assessment and Plan   Acute encephalopathy Seizure  Suspected brain death  -eeg 07/27/2023 looks very flat. On my physical exam he has absent brainstem reflexes. He is off of all sedation. He has no metabolic abnormality which would explain his neuro status P -I think apnea testing would be the appropriate next step. Will review brain death testing policy and d/w my attending. I have d/w neuro  -I discussed this concern with pt dad at bedside who asked  that we talk w pt mom before making plans for apnea test, which  I think is reasonable.  -previously was planned for MRI 7/7 AM -- he cannot go for this while on epo. If on apnea test he is not brain dead, would go for MRI when able  -continue off sedation  -cEEG -neuro following   OOH cardiac arrests Shock -total of 3 arrests including one in ED P -pressors for MAP > 65  -supportive care   Acute hypoxic and hypercarbic respiratory failure  Aspiration PNA P -d/w RT 08-11-23 -- wean epo by 10 q2 hours and see how  he tolerates (unable to go for MRI on epo) -cont zosyn , zyvox   -VAP, pulm hygiene   Acute HFrEF Elevated troponin  -think this is demand ischemia, reflective of multisystem organ failure, hypoxia and not ACS  P -no acute intervention   AKI  Rhabdomyolysis  HypoNa Hypocalcemia  -persistently elevated CK  P -replace lytes as needed -AM CMP CK -strict I/O -add'l 1L LR over 4hr   Elevated LFTs // Shock liver  Hyperbilirubinemia  P -follow PRN. Low utility in trending daily    Anemia Thrombocytopenia  -follow CBC   Polysubstance abuse  -should he survive would need substance cessation support   GOC -I worry that he is likely brain dead. D/w his dad 08-11-23 who appreciates the honesty.  -If he is not brain dead, current exam would be extremely concerning for a devastating neurologic insult and we would need to discuss QOL GOC -he is full code at present   Best Practice (right click and Reselect all SmartList Selections daily)   Diet/type: tubefeeds DVT prophylaxis SCD Pressure ulcer(s): N/A GI prophylaxis: PPI Lines: Central line and Arterial Line Foley:  Yes, and it is still needed Code Status:  full code   Critical care time:    CRITICAL CARE Performed by: Ronnald FORBES Gave   Total critical care time: 67 min  Critical care time was exclusive of separately billable procedures and treating other patients. Critical care was necessary to treat or prevent imminent or life-threatening deterioration.  Critical care was time spent personally by me on the following activities: development of treatment plan with patient and/or surrogate as well as nursing, discussions with consultants, evaluation of patient's response to treatment, examination of patient, obtaining history from patient or surrogate, ordering and performing treatments and interventions, ordering and review of laboratory studies, ordering and review of radiographic studies, pulse oximetry and re-evaluation of  patient's condition.  Ronnald Gave MSN, AGACNP-BC [place] Pulmonary/Critical Care Medicine Amion for pager  08/11/2023, 8:54 AM

## 2023-08-10 NOTE — Progress Notes (Addendum)
   RN declared some minutes ago to me that patient now has cardiac dath - 16:30 Aug 14, 2023     SIGNATURE    Dr. Dorethia Cave, M.D., F.C.C.P,  Pulmonary and Critical Care Medicine Staff Physician, [facility] Director - Interstitial Lung Disease  Program  Pulmonary Fibrosis [facility] [place] [facility] at [facility] [place], [place], [address]   Pager: [phone], If no answer  -> Check AMION or Try [phone] Telephone (clinical office): [phone] Telephone (research): [phone]  4:57 PM 08-14-2023

## 2023-08-10 NOTE — Progress Notes (Signed)
 Apnea test done at bedside with MD X2, RN, PHARMACY, this RT and family.

## 2023-08-10 NOTE — Progress Notes (Signed)
 Nutrition Brief Note  Chart reviewed. Pt now transitioning to comfort care.  No further nutrition interventions planned at this time.  Please re-consult as needed.   Drusilla Kanner, RDN, LDN Clinical Nutrition See AMiON for contact information.

## 2023-08-10 NOTE — Progress Notes (Addendum)
 All medications (gtts) stopped at 1605 prior to extubation. Cardiac TOD 1630 called by two RN. Family present at bedside. Attending JONELLE Amel, MD and KANDICE Sick, NP made aware. Call made to medical examiner, Norleen Shallow, and [address] regarding TOD. No belongings present with pt per family and no request for autopsy.

## 2023-08-10 NOTE — Progress Notes (Signed)
 LTM VIDEO EEG discontinued - no skin breakdown at [facility]

## 2023-08-10 NOTE — Discharge Summary (Signed)
 DISCHARGE SUMMARY    Date of admit: 07/13/2023 12:42 PM Date of discharge: 08/05/23  6:45 PM Length of Stay: 3 days  PCP is Pcp, No  CAUSE(S) OF DEATH  Drug Overdose - Cocaine, Marijuana. Opioids   PROBLEM LIST Principal Problem:   Cardiac arrest [facility]) Active Problems:   Acute respiratory failure with hypoxia (HCC)   Shock (HCC)   Acute systolic heart failure (HCC)   Elevated troponin    Present on Admission Present on Admission:  Cardiac arrest [facility])   Active Problems Principal Problem:   Cardiac arrest [facility]) Active Problems:   Acute respiratory failure with hypoxia (HCC)   Shock (HCC)   Acute systolic heart failure (HCC)   Elevated troponin   Resolved Problems Active Hospital Problems   Diagnosis Date Noted   Cardiac arrest (HCC) 07/13/2023   Shock (HCC) 07/15/2023   Acute systolic heart failure (HCC) 07/15/2023   Elevated troponin 07/15/2023   Acute respiratory failure with hypoxia (HCC) 07/13/2023    Resolved Hospital Problems  No resolved problems to display.     Comprehensive Problem List Patient Active Problem List   Diagnosis Date Noted   Shock (HCC) 07/15/2023   Acute systolic heart failure (HCC) 07/15/2023   Elevated troponin 07/15/2023   Cardiac arrest (HCC) 07/13/2023   Acute respiratory failure with hypoxia (HCC) 07/13/2023   Asthma 08/20/2006      SUMMARY Steven Hood was 23 y.o. patient with    has a past medical history of Asthma, Cocaine abuse (HCC), Environmental allergies, Herpes simplex (2023), Opioid abuse (HCC), Pharyngitis (2024), Polysubstance abuse (HCC), and Seasonal allergies.   has a past surgical history that includes Tympanostomy tube placement.   Admitted on 07/13/2023 with  Steven Hood is a 23 year old male with no reported past medical history who presented to the ED via EMS after being found unresponsive by family, concern for substance abuse/unintentional overdose given drug paraphernalia  found per EMS.  On EMS arrival patient was found asystolic prompting immediate ACLS patient required 12 minutes of CLS protocol prior to ROSC did lose pulses again and around requiring 1 more round of ACLS before acquiring ROSC again.  On ED arrival patient was seen hypothermic with temperature 86.9 F and hypotensive.  Jorde of blood work pending at time of arrival but i-STAT chemistry reveals potassium 6.0 with creatinine 2.0.  Arterial blood gas with pH 7.0, pCO2 53, PO2 460, bicarb 16.6.  PCCM consulted for further management and admission.   COURSE   7/4 found unresponsive in asystole with high suspicion for drug overdose required 12 minutes of ACLS.  Initial problem list Status post PEA cardiac arrest, with unknown period of downtime Acute respiratory failure with hypoxia and hypercapnia Metabolic and respiratory acidosis Circulatory shock High concern for anoxic brain injury Hypothermia Polysubstance abuse Acute kidney injury Hypernatremia Hyperkalemia Iniital management Continue supportive care Started on TTM Follow-up echocardiogram Trend troponin Bedside echocardiogram showed normal heart function Continue lung protective ventilation Vent setting was adjusted to clear hypercapnia Received multiple boluses of bicarbonate Continue bicarbonate infusion Continue vasopressor support MAP goal 65, currently on Levophed , vasopressin  and epinephrine  Patient was found unresponsive for unknown period of time and then he was found in PEA cardiac arrest, he is hypothermic, high risk that he was down for long time Will get head CT Monitor intake and output Avoid nephrotoxic agent Closely monitor and supplement electrolytes Patient received calcium  to temporize hyperkalemia, correct acidosis 7/5 evolving hypoxic respiratory failure in the setting of ARDS  from aspiration pneumonia now proned  7/6 afternoon P/F ratio improved allowing for patient to remain supinated however continuous EEG  did confirm 8 seizures without clinical signs prompting load of Keppra  7/7 - confiemed as Brain Dead and  later in day cardiac death as well       SIGNED Dr. Dorethia Cave, M.D., [facility].C.P Pulmonary and Critical Care Medicine Staff Physician [facility] [place] Pulmonary and Critical Care Pager: [phone], If no answer or between  15:00h - 7:00h: call [phone]  07/31/2023 11:21 AM

## 2023-08-10 DEATH — deceased
